# Patient Record
Sex: Female | Born: 1995 | Race: Black or African American | Hispanic: No | Marital: Single | Smoking: Former smoker
Health system: Southern US, Community
[De-identification: ages and names within clinical notes are randomized; demographics above are authoritative.]

## PROBLEM LIST (undated history)

## (undated) ENCOUNTER — Emergency Department (HOSPITAL_COMMUNITY): Admission: EM | Payer: BLUE CROSS/BLUE SHIELD | Source: Home / Self Care

## (undated) DIAGNOSIS — E282 Polycystic ovarian syndrome: Secondary | ICD-10-CM

## (undated) DIAGNOSIS — N809 Endometriosis, unspecified: Secondary | ICD-10-CM

## (undated) DIAGNOSIS — L0291 Cutaneous abscess, unspecified: Secondary | ICD-10-CM

## (undated) DIAGNOSIS — J45909 Unspecified asthma, uncomplicated: Secondary | ICD-10-CM

## (undated) HISTORY — PX: WISDOM TOOTH EXTRACTION: SHX21

## (undated) HISTORY — PX: LAPAROSCOPIC OVARIAN: SHX5906

---

## 2015-05-12 ENCOUNTER — Emergency Department (HOSPITAL_COMMUNITY)
Admission: EM | Admit: 2015-05-12 | Discharge: 2015-05-12 | Disposition: A | Discharge status: Home / Self Care | Payer: No Typology Code available for payment source | Attending: Emergency Medicine | Admitting: Emergency Medicine

## 2015-05-12 ENCOUNTER — Encounter (HOSPITAL_COMMUNITY): Payer: Self-pay | Admitting: Emergency Medicine

## 2015-05-12 DIAGNOSIS — Z720 Tobacco use: Secondary | ICD-10-CM | POA: Diagnosis not present

## 2015-05-12 DIAGNOSIS — J45909 Unspecified asthma, uncomplicated: Secondary | ICD-10-CM | POA: Diagnosis not present

## 2015-05-12 DIAGNOSIS — H109 Unspecified conjunctivitis: Secondary | ICD-10-CM | POA: Diagnosis not present

## 2015-05-12 DIAGNOSIS — H578 Other specified disorders of eye and adnexa: Secondary | ICD-10-CM | POA: Diagnosis present

## 2015-05-12 HISTORY — DX: Unspecified asthma, uncomplicated: J45.909

## 2015-05-12 MED ORDER — ERYTHROMYCIN 5 MG/GM OP OINT: TOPICAL_OINTMENT | OPHTHALMIC | Status: DC

## 2015-05-12 NOTE — Discharge Instructions (Signed)

## 2015-05-12 NOTE — ED Provider Notes (Signed)
CSN: [id number]     Arrival date & time 05/12/15  2016 History  This chart was scribed for Marlon Pel, PA-C, working with Marily Memos, MD by Elon Spanner, ED Scribe. This patient was seen in room TR06C/TR06C and the patient's care was started at 9:28 PM.   Chief Complaint  Patient presents with  . Conjunctivitis   The history is provided by the patient. No language interpreter was used.    HPI Comments: Amanda Shields is a 19 y.o. female who presents to the Emergency Department complaining of itching, non-painful left eye redness onset this morning upon awaking.  Associated symptoms include matting with goopy discharge, white/yellow after waking from a nap this afternoon.  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  She denies matting, drainage, discharge, vision changes, fever, neck pain, sore throat, ear pain.     Past Medical History  Diagnosis Date  . Asthma    Past Surgical History  Procedure Laterality Date  . Wisdom tooth extraction     No family history on file. Social History  Substance Use Topics  . Smoking status: Current Some Day Smoker  . Smokeless tobacco: None  . Alcohol Use: No   OB History    No data available     Review of Systems A complete 10 system review of systems was obtained and all systems are negative except as noted in the HPI and PMH.   Allergies  Review of patient's allergies indicates no known allergies.  Home Medications   Prior to Admission medications   Medication Sig Start Date End Date Taking? Authorizing Provider  erythromycin ophthalmic ointment Place a 1/2 inch ribbon of ointment BID into the lower eyelid for 7 days 05/12/15   Marlon Pel, PA-C   BP 142/69 mmHg  Pulse 100  Temp(Src) 98.5 F (36.9 C) (Oral)  Resp 20  SpO2 98%  LMP 05/05/2015 Physical Exam  Constitutional: She is oriented to person, place, and time. She  appears well-developed and well-nourished. No distress.  HENT:  Head: Normocephalic and atraumatic.  Eyes: EOM and lids are normal. Pupils are equal, round, and reactive to light. Left conjunctiva is injected. Left eye exhibits normal extraocular motion and no nystagmus.  Neck: Neck supple. No tracheal deviation present.  Cardiovascular: Normal rate.   Pulmonary/Chest: Effort normal. No respiratory distress.  Musculoskeletal: Normal range of motion.  Neurological: She is alert and oriented to person, place, and time.  Skin: Skin is warm and dry.  Psychiatric: She has a normal mood and affect. Her behavior is normal.  Nursing note and vitals reviewed.   ED Course  Procedures (including critical care time)  DIAGNOSTIC STUDIES: Oxygen Saturation is 98% on RA, normal by my interpretation.    COORDINATION OF CARE:  9:31 PM Will prescribe antibiotic.  Patient acknowledges and agrees with plan.    Labs Review Labs Reviewed - No data to display  Imaging Review No results found. I have personally reviewed and evaluated these images and lab results as part of my medical decision-making.   EKG Interpretation None      MDM   Final diagnoses:  Conjunctivitis of left eye    Erythromycin eye ointment,  symptoms consistent with conjunctivitis. Given patient education on how not to spread infection, change pillow case every morning. Advised to put medicine in right eye first, and then in left eye BID for 7  days.  Medications - No data to display  19 y.o.Amanda Shields's evaluation in the Emergency Department is complete. It has been determined that no acute conditions requiring further emergency intervention are present at this time. The patient/guardian have been advised of the diagnosis and plan. We have discussed signs and symptoms that warrant return to the ED, such as changes or worsening in symptoms.  Vital signs are stable at discharge. Filed Vitals:   05/12/15 2100  BP:  142/69  Pulse: 100  Temp: 98.5 F (36.9 C)  Resp: 20    Patient/guardian has voiced understanding and agreed to follow-up with the PCP or specialist.   I personally performed the services described in this documentation, which was scribed in my presence. The recorded information has been reviewed and is accurate.    Marlon Pel, PA-C 05/12/15 2145  Marily Memos, MD 05/13/15 [phone]

## 2015-05-12 NOTE — ED Notes (Signed)
Per patient, states she thinks she has pink eye in her left eye.

## 2015-05-12 NOTE — ED Notes (Signed)
Patient left at this time with all belongings. 

## 2015-06-16 ENCOUNTER — Encounter (HOSPITAL_COMMUNITY): Payer: Self-pay | Admitting: Emergency Medicine

## 2015-06-16 ENCOUNTER — Emergency Department (HOSPITAL_COMMUNITY)
Admission: EM | Admit: 2015-06-16 | Discharge: 2015-06-16 | Disposition: A | Discharge status: Home / Self Care | Payer: No Typology Code available for payment source | Attending: Physician Assistant | Admitting: Physician Assistant

## 2015-06-16 DIAGNOSIS — J069 Acute upper respiratory infection, unspecified: Secondary | ICD-10-CM | POA: Diagnosis not present

## 2015-06-16 DIAGNOSIS — H6501 Acute serous otitis media, right ear: Secondary | ICD-10-CM | POA: Insufficient documentation

## 2015-06-16 DIAGNOSIS — Z720 Tobacco use: Secondary | ICD-10-CM | POA: Diagnosis not present

## 2015-06-16 DIAGNOSIS — J45909 Unspecified asthma, uncomplicated: Secondary | ICD-10-CM | POA: Insufficient documentation

## 2015-06-16 DIAGNOSIS — Z79899 Other long term (current) drug therapy: Secondary | ICD-10-CM | POA: Insufficient documentation

## 2015-06-16 DIAGNOSIS — J029 Acute pharyngitis, unspecified: Secondary | ICD-10-CM | POA: Diagnosis present

## 2015-06-16 LAB — RAPID STREP SCREEN (MED CTR MEBANE ONLY): Streptococcus, Group A Screen (Direct): NEGATIVE

## 2015-06-16 MED ORDER — FLUCONAZOLE 200 MG PO TABS: 200.0000 mg | ORAL_TABLET | Freq: Every day | ORAL | Status: AC

## 2015-06-16 MED ORDER — BENZONATATE 100 MG PO CAPS: 100.0000 mg | ORAL_CAPSULE | Freq: Three times a day (TID) | ORAL | Status: DC

## 2015-06-16 MED ORDER — AMOXICILLIN 500 MG PO CAPS: 500.0000 mg | ORAL_CAPSULE | Freq: Three times a day (TID) | ORAL | Status: DC

## 2015-06-16 MED ORDER — FLUCONAZOLE 200 MG PO TABS: 200.0000 mg | ORAL_TABLET | Freq: Every day | ORAL | Status: DC

## 2015-06-16 NOTE — ED Provider Notes (Signed)
CSN: [id number]     Arrival date & time 06/16/15  1916 History  By signing my name below, I, Tanda Rockers, attest that this documentation has been prepared under the direction and in the presence of Kerrie Buffalo, NP. Electronically Signed: Tanda Rockers, ED Scribe. 06/16/2015. 8:06 PM.  Chief Complaint  Patient presents with  . Sore Throat  . Otalgia   Patient is a 19 y.o. female presenting with pharyngitis and ear pain. The history is provided by the patient. No language interpreter was used.  Sore Throat This is a new problem. The current episode started 2 days ago. The problem occurs rarely. The problem has not changed since onset.Pertinent negatives include no chest pain, no abdominal pain, no headaches and no shortness of breath. The symptoms are aggravated by swallowing and eating. She has tried nothing for the symptoms. The treatment provided no relief.  Otalgia Location:  Right Quality:  Aching Severity:  Mild Onset quality:  Gradual Duration:  1 day Timing:  Constant Progression:  Unchanged Chronicity:  New Relieved by:  None tried Worsened by:  Nothing tried Ineffective treatments:  None tried Associated symptoms: cough and sore throat   Associated symptoms: no abdominal pain, no fever, no headaches and no vomiting      HPI Comments: Starlina Lapre is a 19 y.o. female who presents to the Emergency Department complaining of gradual onset, constant, sore throat x 2 days. Pt states that she woke up with a dry, itching sensation in her throat. She notes that this morning she woke up with right ear pain as well. The ear pain is exacerbated with chewing and swallowing. She also complains of a mild dry cough. Denies fever, chills, nausea, vomiting, abdominal pain, or any other associated symptoms.    Past Medical History  Diagnosis Date  . Asthma    Past Surgical History  Procedure Laterality Date  . Wisdom tooth extraction     No family history on file. Social History   Substance Use Topics  . Smoking status: Current Some Day Smoker  . Smokeless tobacco: None  . Alcohol Use: No   OB History    No data available     Review of Systems  Constitutional: Negative for fever and chills.  HENT: Positive for ear pain (Right) and sore throat.   Respiratory: Positive for cough. Negative for shortness of breath.   Cardiovascular: Negative for chest pain.  Gastrointestinal: Negative for nausea, vomiting and abdominal pain.  Neurological: Negative for headaches.  All other systems reviewed and are negative.  Allergies  Review of patient's allergies indicates no known allergies.  Home Medications   Prior to Admission medications   Medication Sig Start Date End Date Taking? Authorizing Provider  amoxicillin (AMOXIL) 500 MG capsule Take 1 capsule (500 mg total) by mouth 3 (three) times daily. 06/16/15   Hope Orlene Och, NP  benzonatate (TESSALON) 100 MG capsule Take 1 capsule (100 mg total) by mouth every 8 (eight) hours. 06/16/15   Hope Orlene Och, NP  erythromycin ophthalmic ointment Place a 1/2 inch ribbon of ointment BID into the lower eyelid for 7 days 05/12/15   Marlon Pel, PA-C  fluconazole (DIFLUCAN) 200 MG tablet Take 1 tablet (200 mg total) by mouth daily. 06/16/15 06/23/15  Hope Orlene Och, NP   Triage Vitals: BP 146/74 mmHg  Pulse 96  Temp(Src) 99.5 F (37.5 C) (Oral)  Resp 18  Ht  (1.727 m)  Wt 237 lb (107.502 kg)  BMI 36.04 kg/m2  SpO2 99%   Physical Exam  Constitutional: She appears well-developed and well-nourished. No distress.  HENT:  Head: Normocephalic and atraumatic.  Right Ear: No mastoid tenderness. Tympanic membrane is erythematous and bulging.  Left Ear: Tympanic membrane normal.  Mouth/Throat: Uvula is midline. Posterior oropharyngeal erythema present. No posterior oropharyngeal edema.  Right TM is bulging  Eyes: Conjunctivae are normal. Pupils are equal, round, and reactive to light.  Sclera clear  Neck: Normal range of  motion.  Cardiovascular: Normal rate, regular rhythm, normal heart sounds and intact distal pulses.   Pulmonary/Chest: Effort normal and breath sounds normal. She has no wheezes.  Abdominal: Soft. Bowel sounds are normal. There is no tenderness. There is no CVA tenderness.  Musculoskeletal: Normal range of motion.  Lymphadenopathy:    She has no cervical adenopathy.  Neurological: She is alert.  Skin: Skin is warm and dry.  Psychiatric: She has a normal mood and affect.  Nursing note and vitals reviewed.   ED Course  Procedures (including critical care time)  DIAGNOSTIC STUDIES: Oxygen Saturation is 99% on RA, normal by my interpretation.    COORDINATION OF CARE: 8:04 PM-Discussed treatment plan which includes Rx antibiotics and tessalon pearls with pt at bedside and pt agreed to plan.   Labs Review  MDM  19 y.o. female with sore throat and right ear pain x 2 days. Stable for d/c without fever or mastoid tenderness and does not appear toxic.  Will treat for Otitis media and she will follow up with her PCP or return here as needed.   Final diagnoses:  Right acute serous otitis media, recurrence not specified  URI (upper respiratory infection)   I personally performed the services described in this documentation, which was scribed in my presence. The recorded information has been reviewed and is accurate.      [facility] Orlene OchM Neese, NP 06/18/15 0153  Courteney Randall AnLyn Mackuen, MD 06/19/15 [phone]

## 2015-06-16 NOTE — Discharge Instructions (Signed)
Cool Mist Vaporizers Vaporizers may help relieve the symptoms of a cough and cold. They add moisture to the air, which helps mucus to become thinner and less sticky. This makes it easier to breathe and cough up secretions. Cool mist vaporizers do not cause serious burns like hot mist vaporizers, which may also be called steamers or humidifiers. Vaporizers have not been proven to help with colds. You should not use a vaporizer if you are allergic to mold. HOME CARE INSTRUCTIONS  Follow the package instructions for the vaporizer.  Do not use anything other than distilled water in the vaporizer.  Do not run the vaporizer all of the time. This can cause mold or bacteria to grow in the vaporizer.  Clean the vaporizer after each time it is used.  Clean and dry the vaporizer well before storing it.  Stop using the vaporizer if worsening respiratory symptoms develop.   This information is not intended to replace advice given to you by your health care provider. Make sure you discuss any questions you have with your health care provider.   Document Released: 05/18/2004 Document Revised: 08/26/2013 Document Reviewed: 01/08/2013 Elsevier Interactive Patient Education 2016 [employer]  [employer] Mist Vaporizers Vaporizers may help relieve the symptoms of a cough and cold. They add moisture to the air, which helps mucus to become thinner and less sticky. This makes it easier to breathe and cough up secretions. Cool mist vaporizers do not cause serious burns like hot mist vaporizers, which may also be called steamers or humidifiers. Vaporizers have not been proven to help with colds. You should not use a vaporizer if you are allergic to mold. HOME CARE INSTRUCTIONS  Follow the package instructions for the vaporizer.  Do not use anything other than distilled water in the vaporizer.  Do not run the vaporizer all of the time. This can cause mold or bacteria to grow in the vaporizer.  Clean the vaporizer  after each time it is used.  Clean and dry the vaporizer well before storing it.  Stop using the vaporizer if worsening respiratory symptoms develop.   This information is not intended to replace advice given to you by your health care provider. Make sure you discuss any questions you have with your health care provider.   Document Released: 05/18/2004 Document Revised: 08/26/2013 Document Reviewed: 01/08/2013 Elsevier Interactive Patient Education [employer]

## 2015-06-16 NOTE — ED Notes (Signed)
Onset 2 days ago sore throat and right ear pain. Currently pain 2/10 sore.

## 2015-06-19 LAB — CULTURE, GROUP A STREP

## 2015-08-05 ENCOUNTER — Emergency Department (HOSPITAL_COMMUNITY)
Admission: EM | Admit: 2015-08-05 | Discharge: 2015-08-05 | Disposition: A | Discharge status: Home / Self Care | Payer: No Typology Code available for payment source | Attending: Emergency Medicine | Admitting: Emergency Medicine

## 2015-08-05 ENCOUNTER — Encounter (HOSPITAL_COMMUNITY): Payer: Self-pay | Admitting: Neurology

## 2015-08-05 DIAGNOSIS — Z791 Long term (current) use of non-steroidal anti-inflammatories (NSAID): Secondary | ICD-10-CM | POA: Insufficient documentation

## 2015-08-05 DIAGNOSIS — J069 Acute upper respiratory infection, unspecified: Secondary | ICD-10-CM | POA: Insufficient documentation

## 2015-08-05 DIAGNOSIS — H9201 Otalgia, right ear: Secondary | ICD-10-CM | POA: Insufficient documentation

## 2015-08-05 DIAGNOSIS — F17200 Nicotine dependence, unspecified, uncomplicated: Secondary | ICD-10-CM | POA: Insufficient documentation

## 2015-08-05 DIAGNOSIS — J45901 Unspecified asthma with (acute) exacerbation: Secondary | ICD-10-CM | POA: Insufficient documentation

## 2015-08-05 DIAGNOSIS — Z792 Long term (current) use of antibiotics: Secondary | ICD-10-CM | POA: Insufficient documentation

## 2015-08-05 LAB — RAPID STREP SCREEN (MED CTR MEBANE ONLY): Streptococcus, Group A Screen (Direct): NEGATIVE

## 2015-08-05 MED ORDER — IBUPROFEN 800 MG PO TABS: 800.0000 mg | ORAL_TABLET | Freq: Three times a day (TID) | ORAL | Status: DC

## 2015-08-05 MED ORDER — LIDOCAINE VISCOUS 2 % MT SOLN
15.0000 mL | Freq: Once | OROMUCOSAL | Status: AC
  Administered 2015-08-05: 15 mL via OROMUCOSAL
  Filled 2015-08-05: qty 15

## 2015-08-05 MED ORDER — IBUPROFEN 400 MG PO TABS
800.0000 mg | ORAL_TABLET | Freq: Once | ORAL | Status: AC
  Administered 2015-08-05: 800 mg via ORAL
  Filled 2015-08-05: qty 2

## 2015-08-05 MED ORDER — TRAMADOL HCL 50 MG PO TABS
50.0000 mg | ORAL_TABLET | Freq: Once | ORAL | Status: AC
  Administered 2015-08-05: 50 mg via ORAL
  Filled 2015-08-05: qty 1

## 2015-08-05 MED ORDER — LIDOCAINE VISCOUS 2 % MT SOLN: 20.0000 mL | OROMUCOSAL | Status: DC | PRN

## 2015-08-05 NOTE — Discharge Instructions (Signed)
Upper Respiratory Infection, Adult Use the resource guide to find a provider. Take ibuprofen for flulike symptoms and muscle aches. Use the viscous lidocaine to soothe your throat. Return for increased throat swelling or difficulty breathing or swallowing. Most upper respiratory infections (URIs) are a viral infection of the air passages leading to the lungs. A URI affects the nose, throat, and upper air passages. The most common type of URI is nasopharyngitis and is typically referred to as "the common cold." URIs run their course and usually go away on their own. Most of the time, a URI does not require medical attention, but sometimes a bacterial infection in the upper airways can follow a viral infection. This is called a secondary infection. Sinus and middle ear infections are common types of secondary upper respiratory infections. Bacterial pneumonia can also complicate a URI. A URI can worsen asthma and chronic obstructive pulmonary disease (COPD). Sometimes, these complications can require emergency medical care and may be life threatening.  CAUSES Almost all URIs are caused by viruses. A virus is a type of germ and can spread from one person to another.  RISKS FACTORS You may be at risk for a URI if:   You smoke.   You have chronic heart or lung disease.  You have a weakened defense (immune) system.   You are very young or very old.   You have nasal allergies or asthma.  You work in crowded or poorly ventilated areas.  You work in health care facilities or schools. SIGNS AND SYMPTOMS  Symptoms typically develop 2-3 days after you come in contact with a cold virus. Most viral URIs last 7-10 days. However, viral URIs from the influenza virus (flu virus) can last 14-18 days and are typically more severe. Symptoms may include:   Runny or stuffy (congested) nose.   Sneezing.   Cough.   Sore throat.   Headache.   Fatigue.   Fever.   Loss of appetite.   Pain in  your forehead, behind your eyes, and over your cheekbones (sinus pain).  Muscle aches.  DIAGNOSIS  Your health care provider may diagnose a URI by:  Physical exam.  Tests to check that your symptoms are not due to another condition such as:  Strep throat.  Sinusitis.  Pneumonia.  Asthma. TREATMENT  A URI goes away on its own with time. It cannot be cured with medicines, but medicines may be prescribed or recommended to relieve symptoms. Medicines may help:  Reduce your fever.  Reduce your cough.  Relieve nasal congestion. HOME CARE INSTRUCTIONS   Take medicines only as directed by your health care provider.   Gargle warm saltwater or take cough drops to comfort your throat as directed by your health care provider.  Use a warm mist humidifier or inhale steam from a shower to increase air moisture. This may make it easier to breathe.  Drink enough fluid to keep your urine clear or pale yellow.   Eat soups and other clear broths and maintain good nutrition.   Rest as needed.   Return to work when your temperature has returned to normal or as your health care provider advises. You may need to stay home longer to avoid infecting others. You can also use a face mask and careful hand washing to prevent spread of the virus.  Increase the usage of your inhaler if you have asthma.   Do not use any tobacco products, including cigarettes, chewing tobacco, or electronic cigarettes. If you need help  quitting, ask your health care provider. PREVENTION  The best way to protect yourself from getting a cold is to practice good hygiene.   Avoid oral or hand contact with people with cold symptoms.   Wash your hands often if contact occurs.  There is no clear evidence that vitamin C, vitamin E, echinacea, or exercise reduces the chance of developing a cold. However, it is always recommended to get plenty of rest, exercise, and practice good nutrition.  SEEK MEDICAL CARE IF:    You are getting worse rather than better.   Your symptoms are not controlled by medicine.   You have chills.  You have worsening shortness of breath.  You have brown or red mucus.  You have yellow or brown nasal discharge.  You have pain in your face, especially when you bend forward.  You have a fever.  You have swollen neck glands.  You have pain while swallowing.  You have white areas in the back of your throat. SEEK IMMEDIATE MEDICAL CARE IF:   You have severe or persistent:  Headache.  Ear pain.  Sinus pain.  Chest pain.  You have chronic lung disease and any of the following:  Wheezing.  Prolonged cough.  Coughing up blood.  A change in your usual mucus.  You have a stiff neck.  You have changes in your:  Vision.  Hearing.  Thinking.  Mood. MAKE SURE YOU:   Understand these instructions.  Will watch your condition.  Will get help right away if you are not doing well or get worse.   This information is not intended to replace advice given to you by your health care provider. Make sure you discuss any questions you have with your health care provider.   Document Released: 02/14/2001 Document Revised: 01/05/2015 Document Reviewed: 11/26/2013 Elsevier Interactive Patient Education 2016 [employer]  Emergency Department Resource Guide 1) Find a Doctor and Pay Out of Pocket Although you won't have to find out who is covered by your insurance plan, it is a good idea to ask around and get recommendations. You will then need to call the office and see if the doctor you have chosen will accept you as a new patient and what types of options they offer for patients who are self-pay. Some doctors offer discounts or will set up payment plans for their patients who do not have insurance, but you will need to ask so you aren't surprised when you get to your appointment.  2) Contact Your Local Health Department Not all health departments have  doctors that can see patients for sick visits, but many do, so it is worth a call to see if yours does. If you don't know where your local health department is, you can check in your phone book. The CDC also has a tool to help you locate your state's health department, and many state websites also have listings of all of their local health departments.  3) Find a Walk-in Clinic If your illness is not likely to be very severe or complicated, you may want to try a walk in clinic. These are popping up all over the country in pharmacies, drugstores, and shopping centers. They're usually staffed by nurse practitioners or physician assistants that have been trained to treat common illnesses and complaints. They're usually fairly quick and inexpensive. However, if you have serious medical issues or chronic medical problems, these are probably not your best option.  No Primary Care Doctor: - Call Health Connect at  [phone] - they can help you locate a primary care doctor that  accepts your insurance, provides certain services, etc. - Physician Referral Service- [phone]  Chronic Pain Problems: Organization         Address  Phone   Notes  [facility]  [phone] Patients need to be referred by their primary care doctor.   Medication Assistance: Organization         Address  Phone   Notes  [facility] Medication [facility] [address] [place], [place] [address] [phone] --Must be a resident of [facility] -- Must have NO insurance coverage whatsoever (no Medicaid/ Medicare, etc.) -- The pt. MUST have a primary care doctor that directs their care regularly and follows them in the community   Med[facility]ist  [phone]   [employer]  [phone]    Agencies that provide inexpensive medical care: Organization         Address  Phone   Notes  [facility]  [phone]   [facility]    [phone]    [facility] [address] [place], [place] [address] [phone]   [facility] [address] [place], [place] [phone]   [facility]anned Parenthood    [phone]   [facility]    [phone]   [facility] and [facility]  [address], [place] Phone:  [phone], Fax:  [phone] Hours of Operation:  9 am - 6 pm, M-F.  Also accepts Medicaid/Medicare and self-pay.  [facility]  [address], [place] Phone: [phone], Fax: [phone]. Hours of Operation:  8:30 am - 5:30 pm, M-F.  Also accepts Medicaid and self-pay.  [facility] [place] [address], [place] Phone: [phone]   [facility] [address] Natasha Bence [place], [place] [phone], Ext. [phone] Mondays & Thursdays: 7-9 AM.  First 15 patients are seen on a first come, first serve basis.    Medicaid-accepting [facility] Providers:  Organization         Address  Phone   Notes  [facility] [address], [place] [phone] Also accepts self-pay patients.  [facility] [address] [place]ure[place], [place]  [phone]   [facility] [address], [place] [phone]   [facility] [address], [place] [phone]   Renaye Rakers [address], [place]   [phone] Only accepts [place] Access [place] patients after they have their name applied to their card.   Self-Pay (no insurance) in [facility]:  Organization         Address  Phone   Notes  Sickle Cell Patients, [facility] [address] [place], [place] [phone]   [facility] [address] [place], [place] [phone]   [facility] [place]  [address], [place] [phone]   [facility]/Dr. Osei-Bonsu  [address], [place] or [address], [place] [phone] Phone number for both [place] and [place] locations is the same.  [facility] [address], [place] [phone]   [facility] [address], [place]  or [address] [place] [address] [phone]   [facility] [address], [place] [phone], phone; [phone], fax Sees patients 1st and 3rd Saturday of every month.  Must not qualify for public or private insurance (i.e. Medicaid, Medicare, [place] Health Choice, Veterans' Benefits)  Household income should be no more than 200% of the poverty level The clinic cannot treat you if you are pregnant or think you are pregnant  Sexually transmitted diseases are not treated at the clinic.    Dental Care: Organization         Address  Phone  Notes  [facility] Department of [facility] [address] [place], [place] [phone] Accepts children up to age 37 who are enrolled in [place] or [place] Health Choice; pregnant women with a Medicaid card; and children who have applied for Medicaid or [place] Health Choice, but were declined, whose parents can pay a reduced fee at time of service.  [facility]  [address], [place] [phone] Accepts children up to age 24 who are enrolled in [place] or [place] Health Choice; pregnant women with a Medicaid card; and children who have applied for Medicaid or [place] Health Choice, but were declined, whose parents can pay a reduced fee at time of service.  [facility]  [address] [place], [place] [phone] Patients are seen by appointment only. Walk-ins are not accepted. [facility] will see patients 60 years of age and older. Monday - Tuesday (8am-5pm) Most Wednesdays (8:30-5pm) $30 per visit, cash only  [facility]  Adult Dental Access PROGRAM  [address], [facility] [phone] Patients are seen by appointment only. Walk-ins are not accepted. [facility] will see patients 50 years of age and older. One Wednesday Evening (Monthly: Volunteer Based).  $30 per visit, cash only  [employer]  [phone] for adults; Children under age 11, call Graduate Pediatric Dentistry at [phone]. Children aged 45-14, please call [phone] to request a pediatric application.  Dental services are provided in all areas of dental care including fillings, crowns and bridges, complete and partial dentures, implants, gum treatment, root canals, and extractions. Preventive care is also provided. Treatment is provided to both adults and children. Patients are selected via a lottery and there is often a waiting list.   [facility] [address] [place]  [phone] [email or web address]   [facility] [address] [place], [place] [phone], Ext. [phone] Second and Fourth Thursday of each month, opens at 6:30 AM; Clinic ends at 9 AM.  Patients are seen on a first-come first-served basis, and a limited number are seen during each clinic.   [facility]  [address] [place], [place] [phone]   Eligibility Requirements You must have lived in [place], [place], or [place] counties for at least the last three months.   You cannot be eligible for state or federal sponsored [place], including [employer], [place], or [employer].   You generally cannot be eligible for healthcare insurance through your employer.    How to apply: Eligibility screenings are held every Tuesday and Wednesday afternoon from 1:00 pm until 4:00 pm. You do not need an appointment for the interview!  [facility] [address], [place], [place] [phone]   [facility] Department  [phone]   [email or web address]  [employer] Health Department  [phone]   [facility] Health Department  [phone]    Behavioral Health Resources in the Community: Intensive Outpatient Programs Organization         Address  Phone  Notes  [facility] [address], [place], [place] [phone]   [facility] Outpatient [address] [place], [place] [phone]   [facility] [address], [place], [place]  [phone]   [facility] [address]  [place], [place] [phone] or [phone]   Substance Abuse Resources Organization         Address  Phone  Notes  [facility] Drug [facility]  [phone]   [facility]  [phone]   [facility] [place]  [phone]   Floydene Flock  [phone]   Residential & Outpatient Substance Abuse Program  [phone]   Psychological Services Organization         Address  Phone  Notes  [facility]  [phone]   [facility] Services  [phone]   [facility] [address] [place] [phone] or [phone]    Mobile Crisis Teams Organization         Address  Phone  Notes  [facility], Mobile Crisis Care Unit  [phone]   [facility]  [address] [place], [place] [phone]   Doristine Locks [address] [place] [phone]    Self-Help/Support Groups Organization         Address  Phone             Notes  [facility] [place] - variety of support groups  [phone]- [phone] Call for more information  Narcotics Anonymous (NA), [facility] [address], [employer] [place]  2 meetings at this location   Statistician         Address  Phone  Notes  [facility] [address],    [place]  [phone]   [facility]  [address], [place] [id number], [place], [place] [phone]   [facility] Treatment  Facility [address] [place], [place] [phone] Admissions: 8am-3pm M-F  [facility] [address]    [place], [place] [phone]   [facility] [address] [place], [place], [place] [phone]   [facility] [address],  [place], [place] [phone]   [facility] - Intensive Outpatient [address]ure[address], [place], [place] [phone]   [facility] ([facility] [address]  [place], [place] [phone] or [phone]   [facility] ([facility]) [address] [place], [place] [phone] Accepts Medicaid  [employer] [facility] [address],  [place] [phone] Substance Abuse/Addiction Treatment   [facility] Organization         Address  Phone  Notes  [facility]  [phone]   Angie Fava, PhD [address] Ervin Knack [place], [place]   [phone] or [phone]   [facility] Behavioral   [address] [place], [place] [phone]   [facility] [address] [place], [place] [phone] Insurance/Medicaid/sponsorship through [employer] and Families [address]  [place], [place] [phone] [place], [place] [phone]    Dr. Adele Schilder  [phone]   [facility] Dept. 1) [address] [place] 2) [place] 3)  [address], [place] [phone]  [phone]   [place] [phone] or [phone] (After Hours)

## 2015-08-05 NOTE — ED Notes (Signed)
Pt reports right ear pain, sneezing, sore throat for several days.

## 2015-08-05 NOTE — ED Provider Notes (Signed)
CSN: [id number]     Arrival date & time 08/05/15  1424 History  By signing my name below, I, Ronney LionSuzanne Le, attest that this documentation has been prepared under the direction and in the presence of [employer] StoresHanna Patel-Mills, PA-C. Electronically Signed: Ronney LionSuzanne Le, ED Scribe. 08/05/2015. 4:38 PM.    Chief Complaint  Patient presents with  . Otalgia  . Sore Throat   The history is provided by the patient. No language interpreter was used.    HPI Comments: Amanda Shields is a 19 y.o. female with a history of asthma and bronchitis, who presents to the Emergency Department complaining of a gradual-onset, constant, 5/10 sore throat that began several days ago. She then subsequently developed left otalgia, diffuse headache, a cough that is intermittently productive with clear sputum, sneezing, and SOB. Patient notes a history of asthma but states she has not needed to use her inhaler for "a long time." She states she has not taken any medications for her symptoms.   Past Medical History  Diagnosis Date  . Asthma    Past Surgical History  Procedure Laterality Date  . Wisdom tooth extraction     No family history on file. Social History  Substance Use Topics  . Smoking status: Current Some Day Smoker  . Smokeless tobacco: None  . Alcohol Use: No   OB History    No data available     Review of Systems  HENT: Positive for ear pain, sneezing and sore throat.   Respiratory: Positive for shortness of breath.   Neurological: Positive for headaches.   Allergies  Review of patient's allergies indicates no known allergies.  Home Medications   Prior to Admission medications   Medication Sig Start Date End Date Taking? Authorizing Provider  amoxicillin (AMOXIL) 500 MG capsule Take 1 capsule (500 mg total) by mouth 3 (three) times daily. 06/16/15   Hope Orlene OchM Neese, NP  benzonatate (TESSALON) 100 MG capsule Take 1 capsule (100 mg total) by mouth every 8 (eight) hours. 06/16/15   Hope Orlene OchM Neese, NP   erythromycin ophthalmic ointment Place a 1/2 inch ribbon of ointment BID into the lower eyelid for 7 days 05/12/15   Marlon Peliffany Greene, PA-C  ibuprofen (ADVIL,MOTRIN) 800 MG tablet Take 1 tablet (800 mg total) by mouth 3 (three) times daily. 08/05/15   Trayveon Beckford Patel-Mills, PA-C  lidocaine (XYLOCAINE) 2 % solution Use as directed 20 mLs in the mouth or throat as needed for mouth pain. 08/05/15   Manu Rubey Patel-Mills, PA-C   BP 127/80 mmHg  Pulse 87  Temp(Src) 98 F (36.7 C) (Oral)  Resp 16  SpO2 100%  LMP 07/08/2015 Physical Exam  Constitutional: She is oriented to person, place, and time. She appears well-developed and well-nourished. No distress.  HENT:  Head: Normocephalic and atraumatic.  Mouth/Throat: Oropharyngeal exudate and posterior oropharyngeal edema present.  Bilateral tonsillar edema and exudates. No kissing tonsils. No drooling or trismus. No respiratory distress. Uvula is midline without swelling. No hot potato voice. Bilateral TMs and ear canals are without erythema.  Eyes: Conjunctivae and EOM are normal.  Neck: Neck supple. No tracheal deviation present.  No anterior cervical lymphadenopathy.  Cardiovascular: Normal rate.   Pulmonary/Chest: Effort normal and breath sounds normal. No respiratory distress. She has no decreased breath sounds. She has no wheezes. She has no rhonchi. She has no rales.  Lungs are clear to auscultation bilaterally.   Musculoskeletal: Normal range of motion.  Lymphadenopathy:    She has no cervical adenopathy.  Neurological:  She is alert and oriented to person, place, and time.  Skin: Skin is warm and dry.  Psychiatric: She has a normal mood and affect. Her behavior is normal.  Nursing note and vitals reviewed.   ED Course  Procedures (including critical care time)  DIAGNOSTIC STUDIES: Oxygen Saturation is 99% on RA, normal by my interpretation.    COORDINATION OF CARE: 3:23 PM - Pt made aware of negative strep screen. Discussed treatment plan  with pt at bedside which includes symptomatic treatment. Discussed treatment plan with pt which includes ibuprofen and viscous lidocaine solution. Pt refused Decadron injection, stating, "I'll just stick it out." Pt verbalized understanding and agreed to plan.   Labs Review Labs Reviewed  RAPID STREP SCREEN (NOT AT [facility])  CULTURE, GROUP A STREP   MDM   Final diagnoses:  Viral upper respiratory illness  Rapid strep screen negative. Patients symptoms are consistent with URI, likely viral etiology. Discussed that antibiotics are not indicated for viral infections. Pt will be discharged with symptomatic treatment, including ibuprofen and Rx viscous lidocaine solution. Pt refused Decadron injection. Pt verbalizes understanding and is agreeable with plan. Pt is hemodynamically stable & in NAD prior to dc.  I personally performed the services described in this documentation, which was scribed in my presence. The recorded information has been reviewed and is accurate.    Catha Gosselin, PA-C 08/05/15 1638  Lyndal Pulley, MD 08/06/15 [phone]

## 2015-08-05 NOTE — ED Notes (Signed)
Pt able to ambulate independently 

## 2015-08-07 LAB — CULTURE, GROUP A STREP

## 2015-10-28 ENCOUNTER — Emergency Department (HOSPITAL_COMMUNITY)
Admission: EM | Admit: 2015-10-28 | Discharge: 2015-10-28 | Disposition: A | Discharge status: Home / Self Care | Payer: No Typology Code available for payment source | Attending: Emergency Medicine | Admitting: Emergency Medicine

## 2015-10-28 ENCOUNTER — Encounter (HOSPITAL_COMMUNITY): Payer: Self-pay | Admitting: Emergency Medicine

## 2015-10-28 DIAGNOSIS — S39012A Strain of muscle, fascia and tendon of lower back, initial encounter: Secondary | ICD-10-CM | POA: Insufficient documentation

## 2015-10-28 DIAGNOSIS — Y9289 Other specified places as the place of occurrence of the external cause: Secondary | ICD-10-CM | POA: Insufficient documentation

## 2015-10-28 DIAGNOSIS — M545 Low back pain, unspecified: Secondary | ICD-10-CM

## 2015-10-28 DIAGNOSIS — X501XXA Overexertion from prolonged static or awkward postures, initial encounter: Secondary | ICD-10-CM | POA: Insufficient documentation

## 2015-10-28 DIAGNOSIS — Y990 Civilian activity done for income or pay: Secondary | ICD-10-CM | POA: Insufficient documentation

## 2015-10-28 DIAGNOSIS — Y9389 Activity, other specified: Secondary | ICD-10-CM | POA: Insufficient documentation

## 2015-10-28 DIAGNOSIS — Z79899 Other long term (current) drug therapy: Secondary | ICD-10-CM | POA: Insufficient documentation

## 2015-10-28 DIAGNOSIS — J45909 Unspecified asthma, uncomplicated: Secondary | ICD-10-CM | POA: Insufficient documentation

## 2015-10-28 DIAGNOSIS — F17200 Nicotine dependence, unspecified, uncomplicated: Secondary | ICD-10-CM | POA: Insufficient documentation

## 2015-10-28 DIAGNOSIS — Z792 Long term (current) use of antibiotics: Secondary | ICD-10-CM | POA: Insufficient documentation

## 2015-10-28 MED ORDER — IBUPROFEN 800 MG PO TABS: 800.0000 mg | ORAL_TABLET | Freq: Three times a day (TID) | ORAL | Status: DC

## 2015-10-28 NOTE — ED Notes (Signed)
Patient able to ambulate independently  

## 2015-10-28 NOTE — Discharge Instructions (Signed)
1. Medications: ibuprofen, usual home medications 2. Treatment: rest, drink plenty of fluids, gentle stretching as discussed, alternate ice and heat 3. Follow Up: Please followup with your primary doctor in 3 days for discussion of your diagnoses and further evaluation after today's visit; if you do not have a primary care doctor use the resource guide provided to find one;  Return to the ER for worsening back pain, difficulty walking, loss of bowel or bladder control or other concerning symptoms     Back Exercises The following exercises strengthen the muscles that help to support the back. They also help to keep the lower back flexible. Doing these exercises can help to prevent back pain or lessen existing pain. If you have back pain or discomfort, try doing these exercises 2-3 times each day or as told by your health care provider. When the pain goes away, do them once each day, but increase the number of times that you repeat the steps for each exercise (do more repetitions). If you do not have back pain or discomfort, do these exercises once each day or as told by your health care provider. EXERCISES Single Knee to Chest Repeat these steps 3-5 times for each leg: 1. Lie on your back on a firm bed or the floor with your legs extended. 2. Bring one knee to your chest. Your other leg should stay extended and in contact with the floor. 3. Hold your knee in place by grabbing your knee or thigh. 4. Pull on your knee until you feel a gentle stretch in your lower back. 5. Hold the stretch for 10-30 seconds. 6. Slowly release and straighten your leg. Pelvic Tilt Repeat these steps 5-10 times: 1. Lie on your back on a firm bed or the floor with your legs extended. 2. Bend your knees so they are pointing toward the ceiling and your feet are flat on the floor. 3. Tighten your lower abdominal muscles to press your lower back against the floor. This motion will tilt your pelvis so your tailbone points  up toward the ceiling instead of pointing to your feet or the floor. 4. With gentle tension and even breathing, hold this position for 5-10 seconds. Cat-Cow Repeat these steps until your lower back becomes more flexible: 1. Get into a hands-and-knees position on a firm surface. Keep your hands under your shoulders, and keep your knees under your hips. You may place padding under your knees for comfort. 2. Let your head hang down, and point your tailbone toward the floor so your lower back becomes rounded like the back of a cat. 3. Hold this position for 5 seconds. 4. Slowly lift your head and point your tailbone up toward the ceiling so your back forms a sagging arch like the back of a cow. 5. Hold this position for 5 seconds. Press-Ups Repeat these steps 5-10 times: 1. Lie on your abdomen (face-down) on the floor. 2. Place your palms near your head, about shoulder-width apart. 3. While you keep your back as relaxed as possible and keep your hips on the floor, slowly straighten your arms to raise the top half of your body and lift your shoulders. Do not use your back muscles to raise your upper torso. You may adjust the placement of your hands to make yourself more comfortable. 4. Hold this position for 5 seconds while you keep your back relaxed. 5. Slowly return to lying flat on the floor. Bridges Repeat these steps 10 times: 1. Lie on your back  on a firm surface. 2. Bend your knees so they are pointing toward the ceiling and your feet are flat on the floor. 3. Tighten your buttocks muscles and lift your buttocks off of the floor until your waist is at almost the same height as your knees. You should feel the muscles working in your buttocks and the back of your thighs. If you do not feel these muscles, slide your feet 1-2 inches farther away from your buttocks. 4. Hold this position for 3-5 seconds. 5. Slowly lower your hips to the starting position, and allow your buttocks muscles to relax  completely. If this exercise is too easy, try doing it with your arms crossed over your chest. Abdominal Crunches Repeat these steps 5-10 times: 1. Lie on your back on a firm bed or the floor with your legs extended. 2. Bend your knees so they are pointing toward the ceiling and your feet are flat on the floor. 3. Cross your arms over your chest. 4. Tip your chin slightly toward your chest without bending your neck. 5. Tighten your abdominal muscles and slowly raise your trunk (torso) high enough to lift your shoulder blades a tiny bit off of the floor. Avoid raising your torso higher than that, because it can put too much stress on your low back and it does not help to strengthen your abdominal muscles. 6. Slowly return to your starting position. Back Lifts Repeat these steps 5-10 times: 1. Lie on your abdomen (face-down) with your arms at your sides, and rest your forehead on the floor. 2. Tighten the muscles in your legs and your buttocks. 3. Slowly lift your chest off of the floor while you keep your hips pressed to the floor. Keep the back of your head in line with the curve in your back. Your eyes should be looking at the floor. 4. Hold this position for 3-5 seconds. 5. Slowly return to your starting position. SEEK MEDICAL CARE IF:  Your back pain or discomfort gets much worse when you do an exercise.  Your back pain or discomfort does not lessen within 2 hours after you exercise. If you have any of these problems, stop doing these exercises right away. Do not do them again unless your health care provider says that you can. SEEK IMMEDIATE MEDICAL CARE IF:  You develop sudden, severe back pain. If this happens, stop doing the exercises right away. Do not do them again unless your health care provider says that you can.   This information is not intended to replace advice given to you by your health care provider. Make sure you discuss any questions you have with your health care  provider.   Document Released: 09/28/2004 Document Revised: 05/12/2015 Document Reviewed: 10/15/2014 Elsevier Interactive Patient Education 2016 [employer].    Emergency Department Resource Guide 1) Find a Doctor and Pay Out of Pocket Although you won't have to find out who is covered by your insurance plan, it is a good idea to ask around and get recommendations. You will then need to call the office and see if the doctor you have chosen will accept you as a new patient and what types of options they offer for patients who are self-pay. Some doctors offer discounts or will set up payment plans for their patients who do not have insurance, but you will need to ask so you aren't surprised when you get to your appointment.  2) Contact Your Local Health Department Not all health departments have doctors  that can see patients for sick visits, but many do, so it is worth a call to see if yours does. If you don't know where your local health department is, you can check in your phone book. The CDC also has a tool to help you locate your state's health department, and many state websites also have listings of all of their local health departments.  3) Find a [facility] If your illness is not likely to be very severe or complicated, you may want to try a walk in clinic. These are popping up all over the country in pharmacies, drugstores, and shopping centers. They're usually staffed by nurse practitioners or physician assistants that have been trained to treat common illnesses and complaints. They're usually fairly quick and inexpensive. However, if you have serious medical issues or chronic medical problems, these are probably not your best option.  No Primary Care Doctor: - Call Health Connect at  [phone] - they can help you locate a primary care doctor that  accepts your insurance, provides certain services, etc. - Physician Referral Service- [phone]  Chronic Pain  Problems: Organization         Address  Phone   Notes  [facility]  [phone] Patients need to be referred by their primary care doctor.   Medication Assistance: Organization         Address  Phone   Notes  [facility] Medication [facility] [address] [place], [place] [address] [phone] --Must be a resident of [facility] -- Must have NO insurance coverage whatsoever (no Medicaid/ Medicare, etc.) -- The pt. MUST have a primary care doctor that directs their care regularly and follows them in the community   [facility]  [phone]   [employer]  [phone]    Agencies that provide inexpensive medical care: Organization         Address  Phone   Notes  [place]  [phone]   [facility]    [phone]   [facility] [place], [place] [address] [phone]   [place] [address], [place] [phone]   [facility]anned Parenthood    [phone]   [facility]    [phone]   [place] and [address], [place] Phone:  [phone], Fax:  [phone] Hours of Operation:  9 am - 6 pm, M-F.  Also accepts Medicaid/Medicare and self-pay.  [facility] for [address], [address], [place] Phone: [phone], Fax: [phone]. Hours of Operation:  8:30 am - 5:30 pm, M-F.  Also accepts Medicaid and self-pay.  [facility] [place] [address] [place] Phone: [phone]   [place], [place], [place] [phone], Ext. [phone] Mondays & Thursdays: 7-9 AM.  First 15 patients are seen on a first come, first serve basis.    [place] Providers:  Organization         Address  Phone   Notes  [facility] [address], [place] [phone] Also  accepts self-pay patients.  [place], [place]  [phone]   [place], [address], [place] [phone]   [facility] [address] [place] [address], [place] [phone]) [phone]   °Veita Bland [address], [place]  ° ([phone] Only accepts [place] Access Medicaid patients after they have their name applied to their card.  ° °Self-Pay (no insurance) in [place]: ° °Organization         Address  Phone   Notes  °Sickle Cell Patients, [facility] [address], [place] ([phone]   °[facility] [address], [place] ([phone]) [phone]   °[facility] ° [address], [place] ([phone]) [phone]   °[facility]/Dr. Osei-Bonsu ° [address] [place] [address], [place] or [address], [place] ([phone]) [phone] Phone number for both [place] and [place] locations is the same.  °[facility] [address], [place] ([phone]) [phone]   °[facility] [address] [place] [address], [place] or [address] ([phone]) [phone] °(336) [phone]   °[facility] [address], [place] (336) [phone], phone; ([phone], fax Sees patients 1st and 3rd Saturday of every month.  Must not qualify for public or private insurance (i.e. Medicaid, Medicare, [place] Health Choice, Veterans' Benefits) • Household income should be no more than 200% of the poverty level •The clinic cannot treat you if you are pregnant or think you are pregnant • Sexually transmitted diseases are not treated at the clinic.  ° ° °Dental Care: °Organization         Address  Phone  Notes  °[facility] [address], [place] ([phone]) [phone] Accepts children up to age 21 who are enrolled in Medicaid or [place] Health Choice; pregnant  women with a Medicaid card; and children who have applied for Medicaid or [place] Health Choice, but were declined, whose parents can pay a reduced fee at time of service.  °[facility] [place]  [address], [place] ([phone]) [phone] Accepts children up to age 21 who are enrolled in Medicaid or [place] Health Choice; pregnant women with a Medicaid card; and children who have applied for Medicaid or  Health Choice, but were declined, whose parents can pay a reduced fee at time of service.  °[facility] ° [address], [place] ([phone]) [phone] Patients are seen by appointment only. Walk-ins are not accepted. [facility] will see patients 18 years of age and older. °Monday - Tuesday (8am-5pm) °Most Wednesdays (8:30-5pm) °$30 per visit, cash only  °[facility] ° [address], [place] ([phone]) [phone] Patients are seen by appointment only. Walk-ins are not accepted. [facility] will see patients 18 years of age and older. °One Wednesday Evening (Monthly: Volunteer Based).  $30 per visit, cash only  °[facility]  ([phone] for adults; Children under age 4, call Graduate Pediatric Dentistry at [phone]. Children aged 4-14, please call [phone] to request a pediatric application. ° Dental services are provided in all areas of dental care including fillings, crowns and bridges, complete and partial dentures, implants, gum treatment, root canals, and extractions. Preventive care is also provided. Treatment is provided to both adults and children. °Patients are selected via a lottery and there is often a waiting list. °  °[facility] [address], °[place] ° ([phone]) [phone] [email or web address] °  °[facility] [address], [place], [place] (336)[phone], Ext. [phone] Second and Fourth Thursday of each month, opens at 6:30 AM; Clinic ends at 9 AM.  Patients are  seen on a first-come first-served basis, and a limited number are seen during each clinic.  ° °[facility] ° [address], [place], [place] ([phone]) [phone]   Eligibility Requirements °You must have lived in [place], [place], or [place] counties for at least the last three months. °  You cannot be eligible for state or federal sponsored healthcare insurance, including Veterans Administration, Medicaid, or Medicare. °  You generally cannot be eligible for healthcare insurance through your employer.  °  How to apply: °Eligibility screenings are held every Tuesday and Wednesday afternoon from 1:00 pm until 4:00 pm. You do not need an appointment for the interview!  °[facility] [address], [place], [place] [phone]   °[facility] [facility]  336-342-8273   °[facility]  336-703-3100   °[facility]  336-570-6415   ° °Behavioral Health Resources in the Community: °Intensive Outpatient Programs °Organization         Address  Phone  Notes  °[facility] [address], [place], [place] 336-878-6098   °[facility] [address], [place], [place] [phone]   °[facility] [address], [place], [place] ° 336-882-2125   °[facility] [address],  °[place], [place] 1-800-853-5163 or 336-641-4981   °Substance Abuse Resources °Organization         Address  Phone  Notes  °[facility]  336-882-2125   °[facility]  336-784-9470   °[facility]  336-285-9073   °[facility]  336-845-3988   °Residential & Outpatient Substance Abuse Program  1-800-659-3381   °Psychological Services °Organization         Address  Phone  Notes  °[facility]  336- 832-9600   °[facility]  336- 378-7881   °[facility] [address], [place] 1-800-853-5163 or 336-641-4981   ° °Mobile Crisis  Teams °Organization         Address  Phone  Notes  °[facility], Mobile Crisis Care Unit  1-877-626-1772   °Assertive °[facility] ° [address] [place], [place] 336-834-9664   °Sharon DeEsch [address] °[place] 336-554-5454   ° °Self-Help/Support Groups °Organization         Address  Phone             Notes  °[facility] [place] - variety of support groups  336- 373-1402 Call for more information  °Narcotics Anonymous (NA), [facility] [address], °[place]  2 meetings at this location  ° °Residential Treatment Programs °Organization         Address  Phone  Notes  °[facility] [address],    °[place]  1-866-801-8205   °[facility] ° [address], [place], [place] 704-293-8524   °[facility] [address], [place] 336-845-3988 Admissions: 8am-3pm M-F  °[facility] [address]    °[place], [place] 336-841-1104   °[facility] [address], [place], [place] 336-379-7146   °[facility] [address]  °[place],  336-285-9073   °[facility] - Intensive Outpatient [address], [place],  336-852-3033   °  336-852-3033   °[facility] [address]  °[place], [place] 1-877-615-2722 or 336-784-9470   °[facility] ([facility]) [address] [place], [place] 336-227-7417 Accepts Medicaid  °[facility] [address]  °[place] 1-800-659-3381 Substance Abuse/Addiction Treatment  ° °[place] Behavioral Health Resources °Organization         Address  Phone  Notes  °[facility]  (888) 581-9988   °Julie Brannon, PhD [address] [place], [place]   (336) 349-5553 or (336) 951-0000   °[facility]   [address] °[place], [place] (336) 349-4454   °[facility] [address], [place], [place] (336) 342-8316  Insurance/Medicaid/sponsorship through [facility]  °[facility] [address]                                    [place], [place] (336) 342-8316 Therapy/tele-psych/case  °[facility] [address]  ° [place], [place] (336) 349-2233    °Dr. Arfeen  (336) 349-4544   °[facility] [place]  [employer] [place] [facility] 1) [address], [place] °2) [address], [place] °3)  [address], [place] (336) 349-3220 °(336) 342-7768 ° °(336) 342-8140   °[place] Child Abuse Hotline (336) 342-1394 or (336) 342-3537 (After Hours)    ° ° ° °

## 2015-10-28 NOTE — ED Notes (Signed)
Patient here with complaint of lower back pain. States onset 3 weeks ago. Was seen, treated, and released. Presents tonight complaining of the same. States she has followed the original instructions without improvement.

## 2015-10-28 NOTE — ED Provider Notes (Signed)
CSN: [id number]     Arrival date & time 10/28/15  1936 History   First MD Initiated Contact with Patient 10/28/15 2021     Chief Complaint  Patient presents with  . Back Pain     (Consider location/radiation/quality/duration/timing/severity/associated sxs/prior Treatment) The history is provided by the patient and medical records. No language interpreter was used.     Amanda Shields is a 20 y.o. female  with a hx of asthma presents to the Emergency Department complaining of gradual, persistent, low back pain approx 6 weeks ago.  Pt reports the pain began after she lifted a heavy box at work.  She saw a physician who recommended using a heating pad. She reports she has been doing this with moderate improvement however in the last few days he has not helped. She has taken no medications for the pain. No exercises have been tried. Patient reports that bending worsens her pain and heat makes it better but she is able to walk without difficulty. She denies IV drug use, anticoagulants, history of cancer.  , Chills, nausea, vomiting, abdominal pain, weakness, numbness, loss of bowel or bladder control.    Past Medical History  Diagnosis Date  . Asthma    Past Surgical History  Procedure Laterality Date  . Wisdom tooth extraction     No family history on file. Social History  Substance Use Topics  . Smoking status: Current Some Day Smoker  . Smokeless tobacco: None  . Alcohol Use: No   OB History    No data available     Review of Systems  Constitutional: Negative for fever and fatigue.  Respiratory: Negative for chest tightness and shortness of breath.   Cardiovascular: Negative for chest pain.  Gastrointestinal: Negative for nausea, vomiting, abdominal pain and diarrhea.  Genitourinary: Negative for dysuria, urgency, frequency and hematuria.  Musculoskeletal: Positive for back pain. Negative for joint swelling, gait problem, neck pain and neck stiffness.  Skin: Negative for rash.   Neurological: Negative for weakness, light-headedness, numbness and headaches.  All other systems reviewed and are negative.     Allergies  Review of patient's allergies indicates no known allergies.  Home Medications   Prior to Admission medications   Medication Sig Start Date End Date Taking? Authorizing Provider  amoxicillin (AMOXIL) 500 MG capsule Take 1 capsule (500 mg total) by mouth 3 (three) times daily. 06/16/15   Hope Orlene Och, NP  benzonatate (TESSALON) 100 MG capsule Take 1 capsule (100 mg total) by mouth every 8 (eight) hours. 06/16/15   Hope Orlene Och, NP  erythromycin ophthalmic ointment Place a 1/2 inch ribbon of ointment BID into the lower eyelid for 7 days 05/12/15   Marlon Pel, PA-C  ibuprofen (ADVIL,MOTRIN) 800 MG tablet Take 1 tablet (800 mg total) by mouth 3 (three) times daily. 10/28/15   Solomia Harrell, PA-C  lidocaine (XYLOCAINE) 2 % solution Use as directed 20 mLs in the mouth or throat as needed for mouth pain. 08/05/15   Hanna Patel-Mills, PA-C   BP 130/74 mmHg  Pulse 89  Temp(Src) 98.4 F (36.9 C) (Oral)  Resp 16  Ht  (1.727 m)  Wt 112.124 kg  BMI 37.59 kg/m2  SpO2 100% Physical Exam  Constitutional: She appears well-developed and well-nourished. No distress.  HENT:  Head: Normocephalic and atraumatic.  Mouth/Throat: Oropharynx is clear and moist. No oropharyngeal exudate.  Eyes: Conjunctivae are normal.  Neck: Normal range of motion. Neck supple.  Full ROM without pain  Cardiovascular: Normal  rate, regular rhythm, normal heart sounds and intact distal pulses.   Pulmonary/Chest: Effort normal and breath sounds normal. No respiratory distress. She has no wheezes.  Abdominal: Soft. She exhibits no distension. There is no tenderness.  Musculoskeletal:  Full range of motion of the T-spine and L-spine No tenderness to palpation of the spinous processes of the T-spine or L-spine Mild tenderness to palpation of the bilateral paraspinous muscles  of the L-spine  Lymphadenopathy:    She has no cervical adenopathy.  Neurological: She is alert. She has normal reflexes.  Reflex Scores:      Bicep reflexes are 2+ on the right side and 2+ on the left side.      Brachioradialis reflexes are 2+ on the right side and 2+ on the left side.      Patellar reflexes are 2+ on the right side and 2+ on the left side.      Achilles reflexes are 2+ on the right side and 2+ on the left side. Speech is clear and goal oriented, follows commands Normal 5/5 strength in upper and lower extremities bilaterally including dorsiflexion and plantar flexion, strong and equal grip strength Sensation normal to light and sharp touch Moves extremities without ataxia, coordination intact Normal gait Normal balance No Clonus   Skin: Skin is warm and dry. No rash noted. She is not diaphoretic. No erythema.  Psychiatric: She has a normal mood and affect. Her behavior is normal.  Nursing note and vitals reviewed.   ED Course  Procedures (including critical care time)   MDM   Final diagnoses:  Bilateral low back pain without sciatica  Lumbar strain, initial encounter   Amanda Shields presents with low back pain.  No neurological deficits and normal neuro exam.  Patient can walk without difficulty or gait disturbance.  No loss of bowel or bladder control.  No concern for cauda equina.  No fever, night sweats, weight loss, h/o cancer, IVDU.  RICE protocol and ibuprofen indicated and discussed with patient.    Dahlia Client Dolton Shaker, PA-C 10/28/15 1610  Rolan Bucco, MD 10/28/15 2239

## 2016-01-06 ENCOUNTER — Emergency Department (HOSPITAL_COMMUNITY)
Admission: EM | Admit: 2016-01-06 | Discharge: 2016-01-06 | Disposition: A | Discharge status: Home / Self Care | Payer: Medicaid Other | Attending: Emergency Medicine | Admitting: Emergency Medicine

## 2016-01-06 ENCOUNTER — Encounter (HOSPITAL_COMMUNITY): Payer: Self-pay

## 2016-01-06 DIAGNOSIS — Z791 Long term (current) use of non-steroidal anti-inflammatories (NSAID): Secondary | ICD-10-CM | POA: Insufficient documentation

## 2016-01-06 DIAGNOSIS — J45909 Unspecified asthma, uncomplicated: Secondary | ICD-10-CM | POA: Insufficient documentation

## 2016-01-06 DIAGNOSIS — F17200 Nicotine dependence, unspecified, uncomplicated: Secondary | ICD-10-CM | POA: Insufficient documentation

## 2016-01-06 DIAGNOSIS — Z792 Long term (current) use of antibiotics: Secondary | ICD-10-CM | POA: Insufficient documentation

## 2016-01-06 DIAGNOSIS — N611 Abscess of the breast and nipple: Secondary | ICD-10-CM | POA: Insufficient documentation

## 2016-01-06 DIAGNOSIS — Z79899 Other long term (current) drug therapy: Secondary | ICD-10-CM | POA: Insufficient documentation

## 2016-01-06 MED ORDER — SULFAMETHOXAZOLE-TRIMETHOPRIM 800-160 MG PO TABS: 1.0000 | ORAL_TABLET | Freq: Two times a day (BID) | ORAL | Status: DC

## 2016-01-06 MED ORDER — SULFAMETHOXAZOLE-TRIMETHOPRIM 800-160 MG PO TABS
1.0000 | ORAL_TABLET | Freq: Once | ORAL | Status: AC
  Administered 2016-01-06: 1 via ORAL
  Filled 2016-01-06: qty 1

## 2016-01-06 MED ORDER — FLUCONAZOLE 150 MG PO TABS: 150.0000 mg | ORAL_TABLET | Freq: Every day | ORAL | Status: DC

## 2016-01-06 NOTE — ED Notes (Signed)
Pt reports abscess under left breast that she noticed last night with mild drainage.

## 2016-01-06 NOTE — ED Provider Notes (Signed)
CSN: [id number]     Arrival date & time 01/06/16  1539 History  By signing my name below, I, Freida Busmaniana Omoyeni, attest that this documentation has been prepared under the direction and in the presence of non-physician practitioner, Wynetta EmeryNicole Stacye Noori, PA-C. Electronically Signed: Freida Busmaniana Omoyeni, Scribe. 01/06/2016. 5:01 PM.    Chief Complaint  Patient presents with  . Abscess   The history is provided by the patient. No language interpreter was used.   HPI Comments:  Gwynneth AlbrightJasmine Degroat is a 20 y.o. female who presents to the Emergency Department complaining of boil under her right breast noticed yesterday When she removed her bra there was blood and pus on the bra. She squeezed the site and more blood and pus drained. She then cleaned the wound with peroxide and applied neosporin. She notes the size has reduced since. Pt has no other complaints or symptoms at this time.   LNMP 12/27/15  Past Medical History  Diagnosis Date  . Asthma    Past Surgical History  Procedure Laterality Date  . Wisdom tooth extraction     No family history on file. Social History  Substance Use Topics  . Smoking status: Current Some Day Smoker  . Smokeless tobacco: None  . Alcohol Use: No   OB History    No data available     Review of Systems  10 systems reviewed and all are negative for acute change except as noted in the HPI.  Allergies  Review of patient's allergies indicates no known allergies.  Home Medications   Prior to Admission medications   Medication Sig Start Date End Date Taking? Authorizing Provider  amoxicillin (AMOXIL) 500 MG capsule Take 1 capsule (500 mg total) by mouth 3 (three) times daily. 06/16/15   Hope Orlene OchM Neese, NP  benzonatate (TESSALON) 100 MG capsule Take 1 capsule (100 mg total) by mouth every 8 (eight) hours. 06/16/15   Hope Orlene OchM Neese, NP  erythromycin ophthalmic ointment Place a 1/2 inch ribbon of ointment BID into the lower eyelid for 7 days 05/12/15   Marlon Peliffany Greene, PA-C   ibuprofen (ADVIL,MOTRIN) 800 MG tablet Take 1 tablet (800 mg total) by mouth 3 (three) times daily. 10/28/15   Hannah Muthersbaugh, PA-C  lidocaine (XYLOCAINE) 2 % solution Use as directed 20 mLs in the mouth or throat as needed for mouth pain. 08/05/15   Hanna Patel-Mills, PA-C   BP 147/86 mmHg  Pulse 80  Temp(Src) 98.4 F (36.9 C) (Oral)  Resp 16  SpO2 99%  LMP 11/05/2015 Physical Exam  Constitutional: She is oriented to person, place, and time. She appears well-developed and well-nourished. No distress.  HENT:  Head: Normocephalic and atraumatic.  Eyes: Conjunctivae and EOM are normal.  Cardiovascular: Normal rate.   Pulmonary/Chest: Effort normal. No stridor.  Abdominal: She exhibits no distension.  Musculoskeletal: Normal range of motion.  Neurological: She is alert and oriented to person, place, and time.  Skin: Skin is warm and dry. Rash noted.  1 cm nonfluctuant abscess with no warmth, discharge, tenderness to palpation. It appears that there has been a central incision that is draining the abscess  Psychiatric: She has a normal mood and affect.  Nursing note and vitals reviewed.   ED Course  Procedures  DIAGNOSTIC STUDIES:  Oxygen Saturation is 99% on RA, normal by my interpretation.    COORDINATION OF CARE:  5:01 PM Discussed treatment plan with pt at bedside and pt agreed to plan.  Labs Review Labs Reviewed - No data to  display  Imaging Review No results found. I have personally reviewed and evaluated these images and lab results as part of my medical decision-making.   MDM   Final diagnoses:  None    Filed Vitals:   01/06/16 1548  BP: 147/86  Pulse: 80  Temp: 98.4 F (36.9 C)  TempSrc: Oral  Resp: 16  SpO2: 99%    Medications  sulfamethoxazole-trimethoprim (BACTRIM DS,SEPTRA DS) 800-160 MG per tablet 1 tablet (1 tablet Oral Given 01/06/16 1719)    Carollyn Etcheverry is 20 y.o. female presenting with Abscess to breast, it opened and has largely  resolved. Counseled patient on wound care and return precautions, will provide short course of Bactrim.  Evaluation does not show pathology that would require ongoing emergent intervention or inpatient treatment. Pt is hemodynamically stable and mentating appropriately. Discussed findings and plan with patient/guardian, who agrees with care plan. All questions answered. Return precautions discussed and outpatient follow up given.   Discharge Medication List as of 01/06/2016  5:27 PM    START taking these medications   Details  fluconazole (DIFLUCAN) 150 MG tablet Take 1 tablet (150 mg total) by mouth daily. Can repeat in 3 days if symptoms persist, Starting 01/06/2016, Until Discontinued, Print    sulfamethoxazole-trimethoprim (BACTRIM DS) 800-160 MG tablet Take 1 tablet by mouth 2 (two) times daily., Starting 01/06/2016, Until Discontinued, Print         I personally performed the services described in this documentation, which was scribed in my presence. The recorded information has been reviewed and is accurate.    Wynetta Emery, PA-C 01/06/16 1855  Gerhard Munch, MD 01/07/16 2059

## 2016-01-06 NOTE — Discharge Instructions (Signed)
Wash the affected area with soap and water and apply a thin layer of topical antibiotic ointment. Do this every 12 hours.   Do not use rubbing alcohol or hydrogen peroxide.                        Look for signs of infection: if you see redness, if the area becomes warm, if pain increases sharply, there is discharge (pus), if red streaks appear or you develop fever or vomiting, RETURN immediately to the Emergency Department  for a recheck.   Do not hesitate to return to the emergency room for any new, worsening or concerning symptoms.  Please obtain primary care using resource guide below. Let them know that you were seen in the emergency room and that they will need to obtain records for further outpatient management.

## 2016-02-23 ENCOUNTER — Encounter (HOSPITAL_COMMUNITY): Payer: Self-pay | Admitting: Emergency Medicine

## 2016-02-23 ENCOUNTER — Emergency Department (HOSPITAL_COMMUNITY)
Admission: EM | Admit: 2016-02-23 | Discharge: 2016-02-23 | Disposition: A | Discharge status: Home / Self Care | Payer: Medicaid Other | Attending: Emergency Medicine | Admitting: Emergency Medicine

## 2016-02-23 DIAGNOSIS — J45909 Unspecified asthma, uncomplicated: Secondary | ICD-10-CM | POA: Insufficient documentation

## 2016-02-23 DIAGNOSIS — K0889 Other specified disorders of teeth and supporting structures: Secondary | ICD-10-CM | POA: Diagnosis not present

## 2016-02-23 DIAGNOSIS — Z79899 Other long term (current) drug therapy: Secondary | ICD-10-CM | POA: Diagnosis not present

## 2016-02-23 DIAGNOSIS — F17210 Nicotine dependence, cigarettes, uncomplicated: Secondary | ICD-10-CM | POA: Diagnosis not present

## 2016-02-23 DIAGNOSIS — Z791 Long term (current) use of non-steroidal anti-inflammatories (NSAID): Secondary | ICD-10-CM | POA: Insufficient documentation

## 2016-02-23 MED ORDER — AMOXICILLIN 500 MG PO CAPS: 500.0000 mg | ORAL_CAPSULE | Freq: Three times a day (TID) | ORAL | Status: DC

## 2016-02-23 MED ORDER — IBUPROFEN 600 MG PO TABS: 600.0000 mg | ORAL_TABLET | Freq: Four times a day (QID) | ORAL | Status: DC | PRN

## 2016-02-23 NOTE — Discharge Instructions (Signed)
Ibuprofen for pain. Amoxil for infection until all gone. See resources below to follow up with a dentist.   [employer] 211 is a great source of information about community services available.  Access by dialing 2-1-1 from anywhere in [place], or by website -  PooledIncome.pl.   Other Local Resources (Updated 09/2015)  Dental  Care   Services    Phone Number and Address  Cost   [facility] For children 15 - 66 years of age:   Cleaning  Tooth brushing/flossing instruction  Sealants, fillings, crowns  Extractions  Emergency treatment  [phone] [address] [place], [place] [address] Charges based on family income.  Medicaid and some insurance plans accepted.     [facility] - [facility], fillings, crowns  Extractions  Emergency treatment [phone] [place], [place]  Pregnant women 72 years of age or older with a Medicaid card  [facility] [place]  Cleaning  Sealants, fillings, crowns  Extractions  Emergency treatment [phone] [address] [place], [place] Pregnant women 35 years of age or older with a Medicaid card  [facility] of [facility] - [facility] For children 53 - 46 years of age:   Cleaning  Tooth brushing/flossing instruction  Sealants, fillings, crowns  Extractions  Emergency treatment Limited orthodontic services for patients with Medicaid [phone] [place], [place] [address] Medicaid and [facility] Health Choice cover for children up to age 96 and pregnant women.  Parents of children up to age 67 without Medicaid pay a reduced fee at time of service.  [facility] Department of [employer] For children 57 - 18 years of age:   Cleaning  Tooth brushing/flossing instruction  Sealants, fillings,  crowns  Extractions  Emergency treatment Limited orthodontic services for patients with Medicaid [phone] [address] [place], [place].  Medicaid and [place] Health Choice cover for children up to age 32 and pregnant women.  Parents of children up to age 20 without Medicaid pay a reduced fee.  [facility]  Sealants, fillings, crowns  Extractions  Hours: Tuesdays and Thursdays, 4:15 - 8 pm [phone] [address] [place], [place] [address] Services free of charge to [facility] residents ages 18-64 who do not have health insurance, Medicare, [place], or [place] benefits and fall within federal poverty guidelines  [employer]    Provides dental care in addition to primary medical care, nutritional counseling, and pharmacy:  Nurse, mental health, fillings, crowns  Extractions                  [phone] [facility], [address] [place], [place]  [phone] [facility], [address] [place], [place]  [phone] [facility] [place], [place]  [phone] [facility], [address] [place], [place]  [phone] [facility] [address] [place], [place] Accepts [place], [place], most insurance.  Also provides services available to all with fees adjusted based on ability to pay.    [facility]  Cleaning  Tooth brushing/flossing instruction  Sealants, fillings, crowns  Extractions  Emergency treatment Hours: Tuesdays, Thursdays, and Fridays from 8 am to 5 pm by appointment only. [phone] [address] [place], [place]  [phone] [facility] residents with Medicaid (depending on eligibility) and children with [facility] Health Choice - call for more information.  [facility]  Extractions only  Hours: 2nd and  4th Thursday of each month from 6:30 am - 9 am.   [phone]. [phone] [place], [place] [phone] Ages 2318 and older only.  Patients are seen on a first come, first served basis.  [employer]  [employer]  Fillings  Extractions  Orthodontics  Endodontics  Implants/Crowns/Bridges  Complete and partial dentures [phone] 126 [address] [place], [place] Patients must complete an application for services.  There is often a waiting list.

## 2016-02-23 NOTE — ED Notes (Signed)
Patient states dental pain in R lower back gum area.   Patient states "It's in the same area that where my wisdom tooth was.  Something came out, it was skinny and hard like bone, then the swelling got worse".    Patient denies other symptoms.

## 2016-02-23 NOTE — ED Provider Notes (Signed)
CSN: [id number]     Arrival date & time 02/23/16  0932 History  By signing my name below, I, Rosario AdieWilliam Andrew Hiatt, attest that this documentation has been prepared under the direction and in the presence of Hema Lanza, PA-C.   Electronically Signed: Rosario AdieWilliam Andrew Hiatt, ED Scribe. 02/23/2016. 10:15 AM.  Chief Complaint  Patient presents with  . Dental Pain   The history is provided by the patient. No language interpreter was used.   HPI Comments: Amanda Shields is a 20 y.o. female who presents to the Emergency Department complaining of gradual onset, gradually worsening, constant, aching, 5/10 R upper and lower gingival pain onset 2 days ago. She has associated edema to the area of pain. Her pain is exacerbated with chewing and opening her mouth widely. No noted OTC medications or home remedies tried PTA. She reports that an object described as skinny, white, and "hard like bone" came out of the area, and then the area began to swell. She is currently followed by a dentist, but states that she is in college and cannot see him because of the distance. Pt denies fever.   Past Medical History  Diagnosis Date  . Asthma    Past Surgical History  Procedure Laterality Date  . Wisdom tooth extraction     No family history on file. Social History  Substance Use Topics  . Smoking status: Current Some Day Smoker    Types: Cigarettes  . Smokeless tobacco: None  . Alcohol Use: No   OB History    No data available     Review of Systems  Constitutional: Negative for fever.  HENT: Positive for dental problem.    Allergies  Review of patient's allergies indicates no known allergies.  Home Medications   Prior to Admission medications   Medication Sig Start Date End Date Taking? Authorizing Provider  amoxicillin (AMOXIL) 500 MG capsule Take 1 capsule (500 mg total) by mouth 3 (three) times daily. 06/16/15   Hope Orlene OchM Neese, NP  benzonatate (TESSALON) 100 MG capsule Take 1 capsule  (100 mg total) by mouth every 8 (eight) hours. 06/16/15   Hope Orlene OchM Neese, NP  erythromycin ophthalmic ointment Place a 1/2 inch ribbon of ointment BID into the lower eyelid for 7 days 05/12/15   Marlon Peliffany Greene, PA-C  fluconazole (DIFLUCAN) 150 MG tablet Take 1 tablet (150 mg total) by mouth daily. Can repeat in 3 days if symptoms persist 01/06/16   Joni ReiningNicole Pisciotta, PA-C  ibuprofen (ADVIL,MOTRIN) 800 MG tablet Take 1 tablet (800 mg total) by mouth 3 (three) times daily. 10/28/15   Hannah Muthersbaugh, PA-C  lidocaine (XYLOCAINE) 2 % solution Use as directed 20 mLs in the mouth or throat as needed for mouth pain. 08/05/15   Hanna Patel-Mills, PA-C  sulfamethoxazole-trimethoprim (BACTRIM DS) 800-160 MG tablet Take 1 tablet by mouth 2 (two) times daily. 01/06/16   Nicole Pisciotta, PA-C   BP 131/67 mmHg  Pulse 74  Temp(Src) 98.7 F (37.1 C) (Oral)  Resp 20  Ht 5\' 8"  (1.727 m)  Wt 247 lb (112.038 kg)  BMI 37.56 kg/m2  SpO2 100%  LMP 02/08/2016   Physical Exam  Constitutional: She appears well-developed and well-nourished.  HENT:  Head: Normocephalic.  No obvious facial swelling. No obvious dental caries. Gum and dentition appears to be normal. No obvious dental abscesses. Tender to palpation over right lower second molar. No swelling under the tongue. No trismus.  Eyes: Conjunctivae are normal.  Neck: Neck supple.  Cardiovascular: Normal  rate.   Pulmonary/Chest: Effort normal. No respiratory distress.  Abdominal: She exhibits no distension.  Musculoskeletal: Normal range of motion.  Lymphadenopathy:    She has no cervical adenopathy.  Neurological: She is alert.  Skin: Skin is warm and dry.  Psychiatric: She has a normal mood and affect. Her behavior is normal.  Nursing note and vitals reviewed.  ED Course  Procedures (including critical care time)  DIAGNOSTIC STUDIES: Oxygen Saturation is 100% on RA, normal by my interpretation.   COORDINATION OF CARE: 10:12 AM-Discussed next steps  with pt including f/u with a local dental specialist and antibiotics. Pt verbalized understanding and is agreeable with the plan.   MDM   Final diagnoses:  Pain, dental   Patient with right lower tooth pain, no obvious dental infection noted on exam, however given increased pain and tenderness to palpation over the tooth will start her on amoxicillin for possible early infection. Patient will be given a resource guide to follow-up with a dentist. At this time no evidence of Ludwig's angina. Patient's vital signs are stable she is stable for discharge home.  Filed Vitals:   02/23/16 0938  BP: 131/67  Pulse: 74  Temp: 98.7 F (37.1 C)  Resp: 20   I personally performed the services described in this documentation, which was scribed in my presence. The recorded information has been reviewed and is accurate.   Jaynie Crumble, PA-C 02/23/16 1649  Rolland Porter, MD 03/01/16 1742

## 2016-02-29 ENCOUNTER — Encounter (HOSPITAL_COMMUNITY): Payer: Self-pay

## 2016-02-29 ENCOUNTER — Emergency Department (HOSPITAL_COMMUNITY)
Admission: EM | Admit: 2016-02-29 | Discharge: 2016-02-29 | Disposition: A | Discharge status: Home / Self Care | Payer: Medicaid Other | Attending: Emergency Medicine | Admitting: Emergency Medicine

## 2016-02-29 DIAGNOSIS — Z79899 Other long term (current) drug therapy: Secondary | ICD-10-CM | POA: Insufficient documentation

## 2016-02-29 DIAGNOSIS — J45909 Unspecified asthma, uncomplicated: Secondary | ICD-10-CM | POA: Diagnosis not present

## 2016-02-29 DIAGNOSIS — J029 Acute pharyngitis, unspecified: Secondary | ICD-10-CM | POA: Diagnosis present

## 2016-02-29 DIAGNOSIS — F17210 Nicotine dependence, cigarettes, uncomplicated: Secondary | ICD-10-CM | POA: Insufficient documentation

## 2016-02-29 LAB — RAPID STREP SCREEN (MED CTR MEBANE ONLY): Streptococcus, Group A Screen (Direct): NEGATIVE

## 2016-02-29 MED ORDER — IBUPROFEN 400 MG PO TABS
800.0000 mg | ORAL_TABLET | Freq: Once | ORAL | Status: AC
  Administered 2016-02-29: 800 mg via ORAL
  Filled 2016-02-29: qty 2

## 2016-02-29 MED ORDER — DEXAMETHASONE SODIUM PHOSPHATE 10 MG/ML IJ SOLN
10.0000 mg | Freq: Once | INTRAMUSCULAR | Status: AC
  Administered 2016-02-29: 10 mg via INTRAMUSCULAR
  Filled 2016-02-29: qty 1

## 2016-02-29 MED ORDER — FLUCONAZOLE 150 MG PO TABS: 150.0000 mg | ORAL_TABLET | Freq: Every day | ORAL | Status: DC

## 2016-02-29 MED ORDER — AMOXICILLIN-POT CLAVULANATE 875-125 MG PO TABS: 1.0000 | ORAL_TABLET | Freq: Two times a day (BID) | ORAL | Status: DC

## 2016-02-29 NOTE — Discharge Instructions (Signed)
Amanda Shields,  Nice meeting you! Please follow-up with Amanda Shields. Return to the emergency department if you develop fevers, chills, drooling, inability to swallow, increased pain, new/worsening symptoms. Feel better soon!  S. Lane HackerNicole Real Cona, PA-C Pharyngitis Pharyngitis is redness, pain, and swelling (inflammation) of your pharynx.  CAUSES  Pharyngitis is usually caused by infection. Most of the time, these infections are from viruses (viral) and are part of a cold. However, sometimes pharyngitis is caused by bacteria (bacterial). Pharyngitis can also be caused by allergies. Viral pharyngitis may be spread from person to person by coughing, sneezing, and personal items or utensils (cups, forks, spoons, toothbrushes). Bacterial pharyngitis may be spread from person to person by more intimate contact, such as kissing.  SIGNS AND SYMPTOMS  Symptoms of pharyngitis include:   Sore Shields.   Tiredness (fatigue).   Low-grade fever.   Headache.  Joint pain and muscle aches.  Skin rashes.  Swollen lymph nodes.  Plaque-like film on Shields or tonsils (often seen with bacterial pharyngitis). DIAGNOSIS  Your health care provider will ask you questions about your illness and your symptoms. Your medical history, along with a physical exam, is often all that is needed to diagnose pharyngitis. Sometimes, a rapid strep test is done. Other lab tests may also be done, depending on the suspected cause.  TREATMENT  Viral pharyngitis will usually get better in 3-4 days without the use of medicine. Bacterial pharyngitis is treated with medicines that kill germs (antibiotics).  HOME CARE INSTRUCTIONS   Drink enough water and fluids to keep your urine clear or pale yellow.   Only take over-the-counter or prescription medicines as directed by your health care provider:   If you are prescribed antibiotics, make sure you finish them even if you start to feel better.   Do not take  aspirin.   Get lots of rest.   Gargle with 8 oz of salt water ( tsp of salt per 1 qt of water) as often as every 1-2 hours to soothe your Shields.   Shields lozenges (if you are not at risk for choking) or sprays may be used to soothe your Shields. SEEK MEDICAL CARE IF:   You have large, tender lumps in your neck.  You have a rash.  You cough up green, yellow-brown, or bloody spit. SEEK IMMEDIATE MEDICAL CARE IF:   Your neck becomes stiff.  You drool or are unable to swallow liquids.  You vomit or are unable to keep medicines or liquids down.  You have severe pain that does not go away with the use of recommended medicines.  You have trouble breathing (not caused by a stuffy nose). MAKE SURE YOU:   Understand these instructions.  Will watch your condition.  Will get help right away if you are not doing well or get worse.   This information is not intended to replace advice given to you by your health care provider. Make sure you discuss any questions you have with your health care provider.   Document Released: 08/21/2005 Document Revised: 06/11/2013 Document Reviewed: 04/28/2013 Elsevier Interactive Patient Education [employer] Elsevier Inc.

## 2016-02-29 NOTE — ED Notes (Signed)
Per Pt, Pt reports having sore throat for three days. Pt reports it is painful to swallow, tonsils swelling, and white patches in her throat. Pt is in no acute distress at this time. Reports pain has started to go to her right ear. Denies fevers

## 2016-02-29 NOTE — ED Provider Notes (Signed)
CSN: [id number]     Arrival date & time 02/29/16  1551 History  By signing my name below, I, Soijett Blue, attest that this documentation has been prepared under the direction and in the presence of S. Lane HackerNicole Fadia Marlar, PA-C Electronically Signed: Soijett Blue, ED Scribe. 02/29/2016. 5:53 PM.    Chief Complaint  Patient presents with  . Sore Throat      The history is provided by the patient. No language interpreter was used.    HPI Comments: Amanda Shields is a 20 y.o. female who presents to the Emergency Department complaining of sore throat onset 3 days. Pt reports that her sore throat is worsened with swallowing. Denies alleviating factors. Pt notes that she was seen in the ED several days ago for dental pain and Rx amoxil that she hasn't filled yet. Pt states that she has had 3 different episodes of tonsillar swelling within the past two months. She states that she is having associated symptoms of painful swallowing. She states that she has tried OTC 800 mg ibuprofen once daily with no relief for her symptoms. She denies drooling, cough, fever, and any other symptoms. Pt is an occasional cigarette smoker.   Per pt chart review: Pt was seen in the ED on 02/23/2016 for right upper and lower dental pain. Pt was Rx Amoxil for their symptoms.   Past Medical History  Diagnosis Date  . Asthma    Past Surgical History  Procedure Laterality Date  . Wisdom tooth extraction    . Laparoscopic ovarian     No family history on file. Social History  Substance Use Topics  . Smoking status: Current Some Day Smoker    Types: Cigarettes  . Smokeless tobacco: None  . Alcohol Use: No   OB History    No data available     Review of Systems  A complete 10 system review of systems was obtained and all systems are negative except as noted in the HPI and PMH.   Allergies  Review of patient's allergies indicates no known allergies.  Home Medications   Prior to Admission medications    Medication Sig Start Date End Date Taking? Authorizing Provider  amoxicillin (AMOXIL) 500 MG capsule Take 1 capsule (500 mg total) by mouth 3 (three) times daily. 06/16/15   Hope Orlene OchM Neese, NP  amoxicillin (AMOXIL) 500 MG capsule Take 1 capsule (500 mg total) by mouth 3 (three) times daily. 02/23/16   Tatyana Kirichenko, PA-C  benzonatate (TESSALON) 100 MG capsule Take 1 capsule (100 mg total) by mouth every 8 (eight) hours. 06/16/15   Hope Orlene OchM Neese, NP  erythromycin ophthalmic ointment Place a 1/2 inch ribbon of ointment BID into the lower eyelid for 7 days 05/12/15   Marlon Peliffany Greene, PA-C  fluconazole (DIFLUCAN) 150 MG tablet Take 1 tablet (150 mg total) by mouth daily. Can repeat in 3 days if symptoms persist 01/06/16   Joni ReiningNicole Pisciotta, PA-C  ibuprofen (ADVIL,MOTRIN) 600 MG tablet Take 1 tablet (600 mg total) by mouth every 6 (six) hours as needed. 02/23/16   Tatyana Kirichenko, PA-C  ibuprofen (ADVIL,MOTRIN) 800 MG tablet Take 1 tablet (800 mg total) by mouth 3 (three) times daily. 10/28/15   Hannah Muthersbaugh, PA-C  lidocaine (XYLOCAINE) 2 % solution Use as directed 20 mLs in the mouth or throat as needed for mouth pain. 08/05/15   Hanna Patel-Mills, PA-C  sulfamethoxazole-trimethoprim (BACTRIM DS) 800-160 MG tablet Take 1 tablet by mouth 2 (two) times daily. 01/06/16   Wynetta EmeryNicole Pisciotta,  PA-C   BP 138/74 mmHg  Pulse 100  Temp(Src) 98.9 F (37.2 C)  Resp 20  Wt 243 lb 2 oz (110.281 kg)  SpO2 100%  LMP 02/08/2016 Physical Exam  Constitutional: She is oriented to person, place, and time. She appears well-developed and well-nourished. No distress.  HENT:  Head: Normocephalic and atraumatic.  Mouth/Throat: Uvula is midline. No uvula swelling. Posterior oropharyngeal edema present.  Bilateral tonsillar edema. No uvular edema or deviation. Bilateral tonsils with tonsilliths.   Eyes: EOM are normal.  Neck: Neck supple.  Cardiovascular: Normal rate.   Pulmonary/Chest: Effort normal. No respiratory  distress.  Abdominal: She exhibits no distension.  Musculoskeletal: Normal range of motion.  Neurological: She is alert and oriented to person, place, and time.  Skin: Skin is warm and dry.  Psychiatric: She has a normal mood and affect. Her behavior is normal.  Nursing note and vitals reviewed.   ED Course  Procedures  DIAGNOSTIC STUDIES: Oxygen Saturation is 100% on RA, nl by my interpretation.    COORDINATION OF CARE: 5:52 PM Discussed treatment plan with pt at bedside which includes rapid strep screen and culture, decadron injection, ibuprofen, referral to ENT specialist, augmentin Rx, and pt agreed to plan.    Labs Review Labs Reviewed  [facility]NOT [facility])  CULTURE, GROUP A STREP [facility]   I have personally reviewed and evaluated these lab results as part of my medical decision-making.    MDM   Final diagnoses:  Pharyngitis   Rapid strep negative but patient is a smoker with bilateral tonsillar edema and tonsilliths. Patient referred to ENT and given Rx for Augmentin. She requests diflucan for yeast infections. Patient may be safely discharged home. Discussed reasons for return.  Patient in understanding and agreement with the plan.   I personally performed the services described in this documentation, which was scribed in my presence. The recorded information has been reviewed and is accurate.   Melton KrebsSamantha Nicole Merlie Noga, PA-C 03/07/16 [id number]  Lavera Guiseana Duo Liu, MD 03/07/16 [phone]

## 2016-02-29 NOTE — ED Notes (Signed)
PA at bedside.

## 2016-03-03 LAB — CULTURE, GROUP A STREP (THRC)

## 2016-06-18 ENCOUNTER — Encounter (HOSPITAL_COMMUNITY): Payer: Self-pay

## 2016-06-18 ENCOUNTER — Emergency Department (HOSPITAL_COMMUNITY)
Admission: EM | Admit: 2016-06-18 | Discharge: 2016-06-18 | Disposition: A | Discharge status: Home / Self Care | Payer: BLUE CROSS/BLUE SHIELD | Attending: Emergency Medicine | Admitting: Emergency Medicine

## 2016-06-18 DIAGNOSIS — F17210 Nicotine dependence, cigarettes, uncomplicated: Secondary | ICD-10-CM | POA: Insufficient documentation

## 2016-06-18 DIAGNOSIS — J45909 Unspecified asthma, uncomplicated: Secondary | ICD-10-CM | POA: Diagnosis not present

## 2016-06-18 DIAGNOSIS — J029 Acute pharyngitis, unspecified: Secondary | ICD-10-CM | POA: Diagnosis present

## 2016-06-18 DIAGNOSIS — J020 Streptococcal pharyngitis: Secondary | ICD-10-CM | POA: Diagnosis not present

## 2016-06-18 DIAGNOSIS — J069 Acute upper respiratory infection, unspecified: Secondary | ICD-10-CM

## 2016-06-18 LAB — RAPID STREP SCREEN (MED CTR MEBANE ONLY): STREPTOCOCCUS, GROUP A SCREEN (DIRECT): NEGATIVE

## 2016-06-18 MED ORDER — CETIRIZINE HCL 10 MG PO TABS: 10.0000 mg | ORAL_TABLET | Freq: Every day | ORAL | 1 refills | Status: DC

## 2016-06-18 MED ORDER — ACETAMINOPHEN 325 MG PO TABS
ORAL_TABLET | ORAL | Status: AC
  Filled 2016-06-18: qty 2

## 2016-06-18 MED ORDER — FLUCONAZOLE 150 MG PO TABS: 150.0000 mg | ORAL_TABLET | Freq: Once | ORAL | 0 refills | Status: AC

## 2016-06-18 MED ORDER — ACETAMINOPHEN 325 MG PO TABS
650.0000 mg | ORAL_TABLET | Freq: Once | ORAL | Status: AC | PRN
Start: 2016-06-18 — End: 2016-06-18
  Administered 2016-06-18: 650 mg via ORAL

## 2016-06-18 MED ORDER — FLUTICASONE PROPIONATE 50 MCG/ACT NA SUSP: 2.0000 | Freq: Every day | NASAL | 0 refills | Status: DC

## 2016-06-18 MED ORDER — AMOXICILLIN 500 MG PO CAPS: 1000.0000 mg | ORAL_CAPSULE | Freq: Two times a day (BID) | ORAL | 0 refills | Status: DC

## 2016-06-18 MED ORDER — NAPROXEN 250 MG PO TABS: 250.0000 mg | ORAL_TABLET | Freq: Two times a day (BID) | ORAL | 0 refills | Status: DC

## 2016-06-18 NOTE — ED Provider Notes (Signed)
[facility]-EMERGENCY DEPT Provider Note  By signing my name below, I, Earmon PhoenixJennifer Waddell, attest that this documentation has been prepared under the direction and in the presence of Will Jimi Giza, PA-C. Electronically Signed: Earmon PhoenixJennifer Waddell, ED Scribe. 06/18/16. 8:02 PM.   History   Chief Complaint Chief Complaint  Patient presents with  . Sore Throat   The history is provided by the patient and medical records. No language interpreter was used.    HPI Comments:  Amanda Shields is an obese 20 y.o. female who presents to the Emergency Department complaining of sore throat that began about two days ago. She reports associated generalized body aches, sinus pressure, congestion, post nasal drip and cough. She reports subjective fever.  She has not taken anything to treat her symptoms. She denies modifying factors. She denies fever, chills, nausea, vomiting, difficulty swallowing,    Past Medical History:  Diagnosis Date  . Asthma     There are no active problems to display for this patient.   Past Surgical History:  Procedure Laterality Date  . LAPAROSCOPIC OVARIAN    . WISDOM TOOTH EXTRACTION      OB History    No data available       Home Medications    Prior to Admission medications   Medication Sig Start Date End Date Taking? Authorizing Provider  amoxicillin (AMOXIL) 500 MG capsule Take 2 capsules (1,000 mg total) by mouth 2 (two) times daily. 06/18/16   Everlene FarrierWilliam Wilhelmena Zea, PA-C  amoxicillin-clavulanate (AUGMENTIN) 875-125 MG tablet Take 1 tablet by mouth every 12 (twelve) hours. 02/29/16   Melton KrebsSamantha Nicole Riley, PA-C  benzonatate (TESSALON) 100 MG capsule Take 1 capsule (100 mg total) by mouth every 8 (eight) hours. 06/16/15   Hope Orlene OchM Neese, NP  cetirizine (ZYRTEC ALLERGY) 10 MG tablet Take 1 tablet (10 mg total) by mouth daily. 06/18/16   Everlene FarrierWilliam Korby Ratay, PA-C  erythromycin ophthalmic ointment Place a 1/2 inch ribbon of ointment BID into the lower eyelid for 7 days 05/12/15    Marlon Peliffany Greene, PA-C  fluconazole (DIFLUCAN) 150 MG tablet Take 1 tablet (150 mg total) by mouth once. 06/18/16 06/18/16  Everlene FarrierWilliam Carinne Brandenburger, PA-C  fluticasone (FLONASE) 50 MCG/ACT nasal spray Place 2 sprays into both nostrils daily. 06/18/16   Everlene FarrierWilliam Gracia Saggese, PA-C  ibuprofen (ADVIL,MOTRIN) 600 MG tablet Take 1 tablet (600 mg total) by mouth every 6 (six) hours as needed. 02/23/16   Tatyana Kirichenko, PA-C  ibuprofen (ADVIL,MOTRIN) 800 MG tablet Take 1 tablet (800 mg total) by mouth 3 (three) times daily. 10/28/15   Hannah Muthersbaugh, PA-C  lidocaine (XYLOCAINE) 2 % solution Use as directed 20 mLs in the mouth or throat as needed for mouth pain. 08/05/15   Hanna Patel-Mills, PA-C  naproxen (NAPROSYN) 250 MG tablet Take 1 tablet (250 mg total) by mouth 2 (two) times daily with a meal. 06/18/16   Everlene FarrierWilliam Shemicka Cohrs, PA-C  sulfamethoxazole-trimethoprim (BACTRIM DS) 800-160 MG tablet Take 1 tablet by mouth 2 (two) times daily. 01/06/16   Joni ReiningNicole Pisciotta, PA-C    Family History History reviewed. No pertinent family history.  Social History Social History  Substance Use Topics  . Smoking status: Current Some Day Smoker    Types: Cigarettes  . Smokeless tobacco: Never Used  . Alcohol use No     Allergies   Review of patient's allergies indicates no known allergies.   Review of Systems Review of Systems  Constitutional: Negative for chills and fever.  HENT: Positive for congestion, postnasal drip, sinus pressure and sore  throat. Negative for drooling, facial swelling, mouth sores, trouble swallowing and voice change.   Eyes: Negative for visual disturbance.  Respiratory: Positive for cough. Negative for shortness of breath.   Cardiovascular: Negative for chest pain.  Gastrointestinal: Negative for abdominal pain, nausea and vomiting.  Musculoskeletal: Positive for myalgias. Negative for neck pain.  Skin: Negative for rash.     Physical Exam Updated Vital Signs BP 149/100 (BP Location: Left  Arm)   Pulse 115   Temp 100.6 F (38.1 C) (Oral)   Resp 18   LMP 05/19/2016 (Exact Date)   SpO2 98%   Physical Exam  Constitutional: She appears well-developed and well-nourished. No distress.  Nontoxic appearing.  HENT:  Head: Normocephalic and atraumatic.  Right Ear: External ear normal. Tympanic membrane is not erythematous. A middle ear effusion is present.  Left Ear: External ear normal. Tympanic membrane is not erythematous. A middle ear effusion is present.  Mouth/Throat: Uvula is midline and mucous membranes are normal. No trismus in the jaw. No uvula swelling. Oropharyngeal exudate present. No tonsillar abscesses. Tonsillar exudate.  Mild bilateral mid ear effusion bilaterally. No erythema or loss of landmarks. Bilateral tonsillar hypertrophy with exudates. Left greater than right. Uvula midline. No PTA. No trismus. No drooling.  Eyes: Conjunctivae are normal. Pupils are equal, round, and reactive to light. Right eye exhibits no discharge. Left eye exhibits no discharge.  Neck: Normal range of motion. Neck supple. No JVD present. No tracheal deviation present.  Mild cervical lymphadenopathy  Cardiovascular: Normal rate, regular rhythm, normal heart sounds and intact distal pulses.   Pulmonary/Chest: Effort normal and breath sounds normal. No stridor. No respiratory distress. She has no wheezes. She has no rales.  Lungs clear to auscultation bilaterally.  Abdominal: Soft. There is no tenderness.  Lymphadenopathy:    She has cervical adenopathy.  Neurological: She is alert. Coordination normal.  Skin: Skin is warm and dry. Capillary refill takes less than 2 seconds. No rash noted. She is not diaphoretic. No erythema. No pallor.  Psychiatric: She has a normal mood and affect. Her behavior is normal.  Nursing note and vitals reviewed.    ED Treatments / Results  DIAGNOSTIC STUDIES: Oxygen Saturation is 98% on RA, normal by my interpretation.   COORDINATION OF CARE: 8:01  PM- Will prescribe antibiotics and Diflucan per pt's request. Will prescribe Fluticasone nasal spray. Pt verbalizes understanding and agrees to plan.  Medications  acetaminophen (TYLENOL) 325 MG tablet (not administered)  acetaminophen (TYLENOL) tablet 650 mg (650 mg Oral Given 06/18/16 1821)   Labs (all labs ordered are listed, but only abnormal results are displayed) Labs Reviewed  RAPID STREP SCREEN (NOT AT [facility])  CULTURE, GROUP A STREP [facility])    EKG  EKG Interpretation None       Radiology No results found.  Procedures Procedures (including critical care time)  Medications Ordered in ED Medications  acetaminophen (TYLENOL) 325 MG tablet (not administered)  acetaminophen (TYLENOL) tablet 650 mg (650 mg Oral Given 06/18/16 1821)     Initial Impression / Assessment and Plan / ED Course  I have reviewed the triage vital signs and the nursing notes.  Pertinent labs & imaging results that were available during my care of the patient were reviewed by me and considered in my medical decision making (see chart for details).  Clinical Course   This is an obese 20 y.o. female who presents to the Emergency Department complaining of sore throat that began about two days ago. She reports  associated generalized body aches, sinus pressure, congestion, post nasal drip and cough. She reports subjective fever.  On exam the patient is afebrile nontoxic appearing. On arrival she did have a temperature of 100.6. She has moderate bilateral tonsillar hypertrophy with her left greater than right. There is tonsillar exudate. Uvula is midline without edema. No peritonsillar abscess. No trismus. No drooling. She is tolerating by mouth without difficulty. Lungs are clear to auscultation bilaterally. As we were waiting on strep to return we discussed she has a centor criteria score of 4. Plan is to start antibiotics. Then strep returned negative. Will still plan with amoxicillin and have her follow  up with ENT as she had strep last year as well. I advised the patient to follow-up with their primary care provider this week. I advised the patient to return to the emergency department with new or worsening symptoms or new concerns. The patient verbalized understanding and agreement with plan.     Final Clinical Impressions(s) / ED Diagnoses   Final diagnoses:  Strep pharyngitis  Upper respiratory tract infection, unspecified type    New Prescriptions New Prescriptions   AMOXICILLIN (AMOXIL) 500 MG CAPSULE    Take 2 capsules (1,000 mg total) by mouth 2 (two) times daily.   CETIRIZINE (ZYRTEC ALLERGY) 10 MG TABLET    Take 1 tablet (10 mg total) by mouth daily.   FLUCONAZOLE (DIFLUCAN) 150 MG TABLET    Take 1 tablet (150 mg total) by mouth once.   FLUTICASONE (FLONASE) 50 MCG/ACT NASAL SPRAY    Place 2 sprays into both nostrils daily.   NAPROXEN (NAPROSYN) 250 MG TABLET    Take 1 tablet (250 mg total) by mouth 2 (two) times daily with a meal.   I personally performed the services described in this documentation, which was scribed in my presence. The recorded information has been reviewed and is accurate.        Everlene Farrier, PA-C 06/18/16 2016    Charlynne Pander, MD 06/19/16 [phone]

## 2016-06-18 NOTE — ED Triage Notes (Signed)
Pt reports tonsillar inflammation and scratchy throat. She reports this is causing left ear pain as well. Tonsils appear swollen and covered in white patches.

## 2016-06-19 LAB — CULTURE, GROUP A STREP (THRC)

## 2016-07-11 ENCOUNTER — Encounter (HOSPITAL_COMMUNITY): Payer: Self-pay | Admitting: Emergency Medicine

## 2016-07-11 ENCOUNTER — Emergency Department (HOSPITAL_COMMUNITY)
Admission: EM | Admit: 2016-07-11 | Discharge: 2016-07-11 | Disposition: A | Discharge status: Home / Self Care | Payer: BLUE CROSS/BLUE SHIELD | Source: Home / Self Care | Attending: Emergency Medicine | Admitting: Emergency Medicine

## 2016-07-11 DIAGNOSIS — F17210 Nicotine dependence, cigarettes, uncomplicated: Secondary | ICD-10-CM | POA: Diagnosis not present

## 2016-07-11 DIAGNOSIS — L0501 Pilonidal cyst with abscess: Secondary | ICD-10-CM | POA: Diagnosis not present

## 2016-07-11 DIAGNOSIS — L0231 Cutaneous abscess of buttock: Secondary | ICD-10-CM | POA: Insufficient documentation

## 2016-07-11 DIAGNOSIS — Z5321 Procedure and treatment not carried out due to patient leaving prior to being seen by health care provider: Secondary | ICD-10-CM

## 2016-07-11 DIAGNOSIS — J45909 Unspecified asthma, uncomplicated: Secondary | ICD-10-CM | POA: Diagnosis not present

## 2016-07-11 NOTE — ED Notes (Signed)
Called patient 3x, no response. 

## 2016-07-11 NOTE — ED Triage Notes (Signed)
Pt states "theres something wrong with my butt, I cant sit flatter than sitting on my side. Right where my tailbone is its really red and swollen. Pt states its been happening for almost a week". Pt states she's had a cyst there before a year ago and was told it could come back. Cyst went down on its own.

## 2016-07-11 NOTE — ED Triage Notes (Signed)
Pt. reports worsening skin abscess at mid buttocks onset last week with redness/swelling , denies discharge , no fever or chills .

## 2016-07-12 ENCOUNTER — Emergency Department (HOSPITAL_COMMUNITY)
Admission: EM | Admit: 2016-07-12 | Discharge: 2016-07-12 | Disposition: A | Discharge status: Home / Self Care | Payer: BLUE CROSS/BLUE SHIELD | Attending: Emergency Medicine | Admitting: Emergency Medicine

## 2016-07-12 DIAGNOSIS — L0501 Pilonidal cyst with abscess: Secondary | ICD-10-CM

## 2016-07-12 MED ORDER — LIDOCAINE-EPINEPHRINE (PF) 2 %-1:200000 IJ SOLN
20.0000 mL | INTRAMUSCULAR | Status: AC
  Administered 2016-07-12: 20 mL
  Filled 2016-07-12: qty 20

## 2016-07-12 MED ORDER — TRAMADOL HCL 50 MG PO TABS: 50.0000 mg | ORAL_TABLET | Freq: Four times a day (QID) | ORAL | 0 refills | Status: DC | PRN

## 2016-07-12 MED ORDER — NAPROXEN 500 MG PO TABS: 500.0000 mg | ORAL_TABLET | Freq: Two times a day (BID) | ORAL | 0 refills | Status: DC

## 2016-07-12 MED ORDER — SULFAMETHOXAZOLE-TRIMETHOPRIM 800-160 MG PO TABS: 1.0000 | ORAL_TABLET | Freq: Two times a day (BID) | ORAL | 0 refills | Status: AC

## 2016-07-12 MED ORDER — HYDROCODONE-ACETAMINOPHEN 5-325 MG PO TABS
2.0000 | ORAL_TABLET | Freq: Once | ORAL | Status: AC
  Administered 2016-07-12: 2 via ORAL
  Filled 2016-07-12: qty 2

## 2016-07-12 NOTE — ED Provider Notes (Signed)
[facility]-EMERGENCY DEPT Provider Note   CSN: [id number] Arrival date & time: 07/11/16  2258    History   Chief Complaint Chief Complaint  Patient presents with  . Abscess    HPI Amanda Shields is a 20 y.o. female.  The history is provided by the patient. No language interpreter was used.  Abscess  Abscess location: buttock. Abscess quality: painful and redness   Abscess quality: not draining   Red streaking: no   Duration:  1 week Progression:  Worsening Pain details:    Quality:  Pressure   Severity:  Moderate   Timing:  Constant   Progression:  Worsening Chronicity:  Recurrent Context: not diabetes   Relieved by: warm water from shower. Exacerbated by: sitting. Associated symptoms: no fever   Risk factors: prior abscess   Risk factors comment:  States, "I had one a year ago, but it went down on its own".   Past Medical History:  Diagnosis Date  . Asthma     There are no active problems to display for this patient.   Past Surgical History:  Procedure Laterality Date  . LAPAROSCOPIC OVARIAN    . WISDOM TOOTH EXTRACTION      OB History    No data available       Home Medications    Prior to Admission medications   Medication Sig Start Date End Date Taking? Authorizing Provider  amoxicillin (AMOXIL) 500 MG capsule Take 2 capsules (1,000 mg total) by mouth 2 (two) times daily. Patient not taking: Reported on 07/12/2016 06/18/16   Everlene FarrierWilliam Dansie, PA-C  amoxicillin-clavulanate (AUGMENTIN) 875-125 MG tablet Take 1 tablet by mouth every 12 (twelve) hours. Patient not taking: Reported on 07/12/2016 02/29/16   Melton KrebsSamantha Nicole Riley, PA-C  benzonatate (TESSALON) 100 MG capsule Take 1 capsule (100 mg total) by mouth every 8 (eight) hours. Patient not taking: Reported on 07/12/2016 06/16/15   Janne NapoleonHope M Neese, NP  cetirizine (ZYRTEC ALLERGY) 10 MG tablet Take 1 tablet (10 mg total) by mouth daily. Patient not taking: Reported on 07/12/2016 06/18/16   Everlene FarrierWilliam Dansie, PA-C   erythromycin ophthalmic ointment Place a 1/2 inch ribbon of ointment BID into the lower eyelid for 7 days Patient not taking: Reported on 07/12/2016 05/12/15   Marlon Peliffany Greene, PA-C  fluticasone (FLONASE) 50 MCG/ACT nasal spray Place 2 sprays into both nostrils daily. Patient not taking: Reported on 07/12/2016 06/18/16   Everlene FarrierWilliam Dansie, PA-C  ibuprofen (ADVIL,MOTRIN) 600 MG tablet Take 1 tablet (600 mg total) by mouth every 6 (six) hours as needed. Patient not taking: Reported on 07/12/2016 02/23/16   Tatyana Kirichenko, PA-C  ibuprofen (ADVIL,MOTRIN) 800 MG tablet Take 1 tablet (800 mg total) by mouth 3 (three) times daily. Patient not taking: Reported on 07/12/2016 10/28/15   Dahlia ClientHannah Muthersbaugh, PA-C  lidocaine (XYLOCAINE) 2 % solution Use as directed 20 mLs in the mouth or throat as needed for mouth pain. Patient not taking: Reported on 07/12/2016 08/05/15   Catha GosselinHanna Patel-Mills, PA-C  naproxen (NAPROSYN) 500 MG tablet Take 1 tablet (500 mg total) by mouth 2 (two) times daily. 07/12/16   Antony MaduraKelly Song Myre, PA-C  sulfamethoxazole-trimethoprim (BACTRIM DS,SEPTRA DS) 800-160 MG tablet Take 1 tablet by mouth 2 (two) times daily. 07/12/16 07/19/16  Antony MaduraKelly Jurrell Royster, PA-C  traMADol (ULTRAM) 50 MG tablet Take 1 tablet (50 mg total) by mouth every 6 (six) hours as needed for severe pain. 07/12/16   Antony MaduraKelly Ermelinda Eckert, PA-C    Family History No family history on file.  Social History  Social History  Substance Use Topics  . Smoking status: Current Some Day Smoker    Types: Cigarettes  . Smokeless tobacco: Never Used  . Alcohol use No     Allergies   Patient has no known allergies.   Review of Systems Review of Systems  Constitutional: Negative for fever.  Ten systems reviewed and are negative for acute change, except as noted in the HPI.     Physical Exam Updated Vital Signs BP 152/91   Pulse 99   Temp 98.6 F (37 C) (Oral)   Resp 18   LMP 06/21/2016   SpO2 100%   Physical Exam  Constitutional: She is  oriented to person, place, and time. She appears well-developed and well-nourished. No distress.  Nontoxic and in NAD  HENT:  Head: Normocephalic and atraumatic.  Eyes: Conjunctivae and EOM are normal. No scleral icterus.  Neck: Normal range of motion.  Cardiovascular: Normal rate, regular rhythm and intact distal pulses.   Pulmonary/Chest: Effort normal. No respiratory distress.  Respirations even and unlabored  Genitourinary:     Musculoskeletal: Normal range of motion.  Neurological: She is alert and oriented to person, place, and time.  Skin: Skin is warm and dry. No rash noted. She is not diaphoretic. There is erythema. No pallor.  See GU exam  Psychiatric: She has a normal mood and affect. Her behavior is normal.  Nursing note and vitals reviewed.    ED Treatments / Results  Labs (all labs ordered are listed, but only abnormal results are displayed) Labs Reviewed - No data to display  EKG  EKG Interpretation None       Radiology No results found.  Procedures Procedures (including critical care time)  Medications Ordered in ED Medications  HYDROcodone-acetaminophen (NORCO/VICODIN) 5-325 MG per tablet 2 tablet (not administered)  lidocaine-EPINEPHrine (XYLOCAINE W/EPI) 2 %-1:200000 (PF) injection 20 mL (20 mLs Infiltration Given 07/12/16 0101)     Initial Impression / Assessment and Plan / ED Course  I have reviewed the triage vital signs and the nursing notes.  Pertinent labs & imaging results that were available during my care of the patient were reviewed by me and considered in my medical decision making (see chart for details).  Clinical Course     1:25 AM INCISION AND DRAINAGE Performed by: Antony MaduraHUMES, Jamyra Zweig Consent: Verbal consent obtained. Risks and benefits: risks, benefits and alternatives were discussed Type: abscess  Body area: R intergluteal cleft  Anesthesia: local infiltration  Incision was made with a scalpel.  Local anesthetic:  lidocaine 2% with epinephrine  Anesthetic total: 6 ml  Complexity: complex Blunt dissection to break up loculations  Drainage: purulent  Drainage amount: copious  Packing material: 1/4 in iodoform gauze  Patient tolerance: Patient tolerated the procedure well with no immediate complications.   1:42 AM Patient with skin abscess amenable to incision and drainage. Abscess packed in light of location to encourage further drainage; wound recheck in 2 days advised for packing removal. Encouraged home warm soaks and flushing. Mild signs of cellulitis is surrounding skin. Will d/c home on Bactrim to ensure resolution of symptoms. Patient agreeable to plan with no unaddressed concerns. Discharged in stable condition.    Final Clinical Impressions(s) / ED Diagnoses   Final diagnoses:  Pilonidal abscess    New Prescriptions New Prescriptions   NAPROXEN (NAPROSYN) 500 MG TABLET    Take 1 tablet (500 mg total) by mouth 2 (two) times daily.   SULFAMETHOXAZOLE-TRIMETHOPRIM (BACTRIM DS,SEPTRA DS) 800-160 MG TABLET  Take 1 tablet by mouth 2 (two) times daily.   TRAMADOL (ULTRAM) 50 MG TABLET    Take 1 tablet (50 mg total) by mouth every 6 (six) hours as needed for severe pain.     Antony Madura, PA-C 07/12/16 0142    Glynn Octave, MD 07/12/16 [phone]

## 2016-07-12 NOTE — Discharge Instructions (Signed)
Apply warm compresses 3-4 times per day to promote drainage. Take Bactrim as prescribed. You may take tramadol for pain that is not well-controlled with naproxen. Return in 2 days for packing removal and recheck of your abscess. You may return sooner for any new or concerning symptoms.

## 2016-07-14 ENCOUNTER — Emergency Department (HOSPITAL_COMMUNITY)
Admission: EM | Admit: 2016-07-14 | Discharge: 2016-07-14 | Disposition: A | Discharge status: Home / Self Care | Payer: BLUE CROSS/BLUE SHIELD | Attending: Emergency Medicine | Admitting: Emergency Medicine

## 2016-07-14 ENCOUNTER — Encounter (HOSPITAL_COMMUNITY): Payer: Self-pay

## 2016-07-14 DIAGNOSIS — Z4801 Encounter for change or removal of surgical wound dressing: Secondary | ICD-10-CM | POA: Insufficient documentation

## 2016-07-14 DIAGNOSIS — J45909 Unspecified asthma, uncomplicated: Secondary | ICD-10-CM | POA: Insufficient documentation

## 2016-07-14 DIAGNOSIS — F17210 Nicotine dependence, cigarettes, uncomplicated: Secondary | ICD-10-CM | POA: Insufficient documentation

## 2016-07-14 DIAGNOSIS — Z5189 Encounter for other specified aftercare: Secondary | ICD-10-CM

## 2016-07-14 DIAGNOSIS — L0501 Pilonidal cyst with abscess: Secondary | ICD-10-CM

## 2016-07-14 MED ORDER — FLUCONAZOLE 100 MG PO TABS
150.0000 mg | ORAL_TABLET | Freq: Once | ORAL | Status: AC
  Administered 2016-07-14: 150 mg via ORAL
  Filled 2016-07-14: qty 2

## 2016-07-14 MED ORDER — FLUCONAZOLE 150 MG PO TABS: 150.0000 mg | ORAL_TABLET | Freq: Once | ORAL | 0 refills | Status: AC

## 2016-07-14 NOTE — ED Provider Notes (Signed)
[facility] Provider Note   CSN: [id number] Arrival date & time: 07/14/16  1902     History   Chief Complaint Chief Complaint  Patient presents with  . Abscess    HPI Amanda Shields is a 20 y.o. female.  HPI   Patient is a 20 year old female who presents to the ER for abscess recheck and packing removal.  She reports mild continued drainage onto gauze dressings, improved pain, and decreased redness and swelling.  She has been taking her antibiotic as prescribed and has developed a yeast infection, request Diflucan to treat this, she has had this in the past.  She denies fevers, chills, sweats, nausea, vomiting.  Past Medical History:  Diagnosis Date  . Asthma     There are no active problems to display for this patient.   Past Surgical History:  Procedure Laterality Date  . LAPAROSCOPIC OVARIAN    . WISDOM TOOTH EXTRACTION      OB History    No data available       Home Medications    Prior to Admission medications   Medication Sig Start Date End Date Taking? Authorizing Provider  amoxicillin (AMOXIL) 500 MG capsule Take 2 capsules (1,000 mg total) by mouth 2 (two) times daily. Patient not taking: Reported on 07/12/2016 06/18/16   Everlene Farrier, PA-C  amoxicillin-clavulanate (AUGMENTIN) 875-125 MG tablet Take 1 tablet by mouth every 12 (twelve) hours. Patient not taking: Reported on 07/12/2016 02/29/16   Melton Krebs, PA-C  benzonatate (TESSALON) 100 MG capsule Take 1 capsule (100 mg total) by mouth every 8 (eight) hours. Patient not taking: Reported on 07/12/2016 06/16/15   Janne Napoleon, NP  cetirizine (ZYRTEC ALLERGY) 10 MG tablet Take 1 tablet (10 mg total) by mouth daily. Patient not taking: Reported on 07/12/2016 06/18/16   Everlene Farrier, PA-C  erythromycin ophthalmic ointment Place a 1/2 inch ribbon of ointment BID into the lower eyelid for 7 days Patient not taking: Reported on 07/12/2016 05/12/15   Marlon Pel, PA-C  fluconazole  (DIFLUCAN) 150 MG tablet Take 1 tablet (150 mg total) by mouth once. 07/14/16 07/14/16  Danelle Berry, PA-C  fluticasone (FLONASE) 50 MCG/ACT nasal spray Place 2 sprays into both nostrils daily. Patient not taking: Reported on 07/12/2016 06/18/16   Everlene Farrier, PA-C  ibuprofen (ADVIL,MOTRIN) 600 MG tablet Take 1 tablet (600 mg total) by mouth every 6 (six) hours as needed. Patient not taking: Reported on 07/12/2016 02/23/16   Tatyana Kirichenko, PA-C  ibuprofen (ADVIL,MOTRIN) 800 MG tablet Take 1 tablet (800 mg total) by mouth 3 (three) times daily. Patient not taking: Reported on 07/12/2016 10/28/15   Dahlia Client Muthersbaugh, PA-C  lidocaine (XYLOCAINE) 2 % solution Use as directed 20 mLs in the mouth or throat as needed for mouth pain. Patient not taking: Reported on 07/12/2016 08/05/15   Catha Gosselin, PA-C  naproxen (NAPROSYN) 500 MG tablet Take 1 tablet (500 mg total) by mouth 2 (two) times daily. 07/12/16   Antony Madura, PA-C  sulfamethoxazole-trimethoprim (BACTRIM DS,SEPTRA DS) 800-160 MG tablet Take 1 tablet by mouth 2 (two) times daily. 07/12/16 07/19/16  Antony Madura, PA-C  traMADol (ULTRAM) 50 MG tablet Take 1 tablet (50 mg total) by mouth every 6 (six) hours as needed for severe pain. 07/12/16   Antony Madura, PA-C    Family History History reviewed. No pertinent family history.  Social History Social History  Substance Use Topics  . Smoking status: Current Some Day Smoker    Types: Cigarettes  .  Smokeless tobacco: Never Used  . Alcohol use No     Allergies   Patient has no known allergies.   Review of Systems Review of Systems  All other systems reviewed and are negative.    Physical Exam Updated Vital Signs BP 124/56 (BP Location: Right Arm)   Pulse 74   Temp 98.5 F (36.9 C) (Oral)   Resp 16   Ht 5\' 8"  (1.727 m)   Wt 113.9 kg   LMP 06/21/2016   SpO2 100%   BMI 38.18 kg/m   Physical Exam  Constitutional: She is oriented to person, place, and time. She appears  well-developed and well-nourished. No distress.  HENT:  Head: Normocephalic and atraumatic.  Right Ear: External ear normal.  Left Ear: External ear normal.  Nose: Nose normal.  Mouth/Throat: Oropharynx is clear and moist.  Eyes: Conjunctivae are normal. Pupils are equal, round, and reactive to light. Right eye exhibits no discharge. Left eye exhibits no discharge. No scleral icterus.  Neck: Normal range of motion.  Cardiovascular: Normal rate and regular rhythm.   Pulmonary/Chest: Effort normal and breath sounds normal. No stridor. No respiratory distress.  Musculoskeletal: Normal range of motion. She exhibits no edema.  Neurological: She is alert and oriented to person, place, and time. She exhibits normal muscle tone. Coordination normal.  Skin: Skin is warm. No rash noted. She is not diaphoretic. No erythema. No pallor.     Psychiatric: She has a normal mood and affect. Her behavior is normal. Judgment and thought content normal.  Nursing note and vitals reviewed.    ED Treatments / Results  Labs (all labs ordered are listed, but only abnormal results are displayed) Labs Reviewed - No data to display  EKG  EKG Interpretation None       Radiology No results found.  Procedures Procedures (including critical care time)    Medications Ordered in ED Medications  fluconazole (DIFLUCAN) tablet 150 mg (150 mg Oral Given 07/14/16 2142)     Initial Impression / Assessment and Plan / ED Course  I have reviewed the triage vital signs and the nursing notes.  Pertinent labs & imaging results that were available during my care of the patient were reviewed by me and considered in my medical decision making (see chart for details).  Clinical Course     Pt with abscess seen two days ago with I&D. Chart reviewed in [facility]. Pt has been doing a very good job of taking care of the site at home and has been taking medications as directed. The iodoform packing ws mostly hanging out  and was removed, tolerated well.  The site is healthy appearing, with only a small amount of purulent drainage present on gauze, no drainage from incision, is open and without significant drainage. No surrounding cellulitis and no palpable fluctuance or induration indicating deeper infection or residual abscess. Pt is also without any concerning tenderness in the area, pain is significantly improved. Discussed home care and return precautions, pt advised to continue all antibiotics until they are gone. Complained of yeast infection, no improvement yet with monistat cream externally, requests flagyl, has needed two doses in the past with oral antibiotics   Final Clinical Impressions(s) / ED Diagnoses   Final diagnoses:  Wound check, abscess  Pilonidal abscess    New Prescriptions Discharge Medication List as of 07/14/2016  9:30 PM    START taking these medications   Details  fluconazole (DIFLUCAN) 150 MG tablet Take 1 tablet (150 mg total)  by mouth once., Starting Fri 07/14/2016, Print         Danelle BerryLeisa Kili Gracy, PA-C 07/14/16 2223    Gwyneth SproutWhitney Plunkett, MD 07/14/16 2232

## 2016-07-14 NOTE — ED Triage Notes (Signed)
Pt states seen here Tuesday for abscess. Pt states had packing placed. Returned today for removal of packing and general recheck. Pt states has continued to take abx as rx. Pt denies any fevers or chills.

## 2016-10-29 ENCOUNTER — Encounter (HOSPITAL_COMMUNITY): Payer: Self-pay | Admitting: Emergency Medicine

## 2016-10-29 ENCOUNTER — Ambulatory Visit (HOSPITAL_COMMUNITY)
Admission: EM | Admit: 2016-10-29 | Discharge: 2016-10-29 | Disposition: A | Discharge status: Home / Self Care | Payer: BLUE CROSS/BLUE SHIELD | Attending: Family Medicine | Admitting: Family Medicine

## 2016-10-29 DIAGNOSIS — J069 Acute upper respiratory infection, unspecified: Secondary | ICD-10-CM

## 2016-10-29 DIAGNOSIS — B9789 Other viral agents as the cause of diseases classified elsewhere: Secondary | ICD-10-CM

## 2016-10-29 NOTE — ED Provider Notes (Signed)
CSN: [id number]     Arrival date & time 10/29/16  1833 History   First MD Initiated Contact with Patient 10/29/16 1904     Chief Complaint  Patient presents with  . Cough   (Consider location/radiation/quality/duration/timing/severity/associated sxs/prior Treatment) 21 yo presents with cough, nasal congestion and fatigue. No known fevers. Symptoms started last night. No known exposures. Did not have flu vaccine.       Past Medical History:  Diagnosis Date  . Asthma    Past Surgical History:  Procedure Laterality Date  . LAPAROSCOPIC OVARIAN    . WISDOM TOOTH EXTRACTION     History reviewed. No pertinent family history. Social History  Substance Use Topics  . Smoking status: Current Some Day Smoker    Types: Cigarettes  . Smokeless tobacco: Never Used  . Alcohol use No   OB History    No data available     Review of Systems  Constitutional: Positive for fatigue. Negative for fever.  HENT: Positive for congestion and sinus pain.   Respiratory: Positive for cough. Negative for shortness of breath and wheezing.     Allergies  Patient has no known allergies.  Home Medications   Prior to Admission medications   Not on File   Meds Ordered and Administered this Visit  Medications - No data to display  BP 129/67 (BP Location: Right Arm)   Pulse 95   Temp 99.6 F (37.6 C) (Oral)   Resp 18   LMP 09/27/2016 (Exact Date)   SpO2 98%  No data found.   Physical Exam  Constitutional: She is oriented to person, place, and time. She appears well-developed and well-nourished. No distress.  HENT:  Head: Normocephalic and atraumatic.  Right Ear: External ear normal.  Left Ear: External ear normal.  Nose: Nose normal.  Mouth/Throat: Oropharynx is clear and moist.  Neck: Normal range of motion.  Pulmonary/Chest: Effort normal. She has no wheezes.  Lymphadenopathy:    She has no cervical adenopathy.  Neurological: She is alert and oriented to person, place, and time.   Skin: Skin is warm and dry. She is not diaphoretic.  Psychiatric: Her behavior is normal.  Nursing note and vitals reviewed.   Urgent Care Course     Procedures (including critical care time)  Labs Review Labs Reviewed - No data to display  Imaging Review No results found.   Visual Acuity Review  Right Eye Distance:   Left Eye Distance:   Bilateral Distance:    Right Eye Near:   Left Eye Near:    Bilateral Near:         MDM   1. Viral URI with cough    Viral illness. Treat with symptomatic care, fluids and rest. FU if worsens.     Riki SheerMichelle G Liliyana Thobe, PA-C 10/29/16 1919    Riki SheerMichelle G Nuchem Grattan, PA-C 10/29/16 Ernestina [id number]

## 2016-10-29 NOTE — Discharge Instructions (Signed)
You have a viral infection. Treat with rest and fluids. Ibuprofen 800 mg every 8 hours with Delsym as directed for cough can be beneficial as well. FU if you become worse.

## 2016-10-29 NOTE — ED Triage Notes (Signed)
The [facility] with a complaint of a cough, congestion and sinus pressure that started last night.

## 2016-12-07 ENCOUNTER — Encounter (HOSPITAL_COMMUNITY): Payer: Self-pay | Admitting: Family Medicine

## 2016-12-07 ENCOUNTER — Ambulatory Visit (HOSPITAL_COMMUNITY)
Admission: EM | Admit: 2016-12-07 | Discharge: 2016-12-07 | Disposition: A | Discharge status: Home / Self Care | Payer: BLUE CROSS/BLUE SHIELD | Attending: Family Medicine | Admitting: Family Medicine

## 2016-12-07 DIAGNOSIS — F17210 Nicotine dependence, cigarettes, uncomplicated: Secondary | ICD-10-CM | POA: Diagnosis not present

## 2016-12-07 DIAGNOSIS — N898 Other specified noninflammatory disorders of vagina: Secondary | ICD-10-CM | POA: Insufficient documentation

## 2016-12-07 LAB — POCT PREGNANCY, URINE: Preg Test, Ur: NEGATIVE

## 2016-12-07 MED ORDER — FLUCONAZOLE 150 MG PO TABS: 150.0000 mg | ORAL_TABLET | Freq: Once | ORAL | 0 refills | Status: AC

## 2016-12-07 NOTE — Discharge Instructions (Signed)
The tests that are being run will be completed in the next two days.

## 2016-12-07 NOTE — ED Triage Notes (Signed)
Pt  Reports  A   Thick  White  Discharge  For  Several  Days   Denies   Any  Sores or  Lesions     Ambulates  Upright  With a  Steady  Fluid  Gait        Had  Some  Pain   On   Sex      On last occurance

## 2016-12-07 NOTE — ED Provider Notes (Signed)
[facility]    CSN: [id number] Arrival date & time: 12/07/16  1242     History   Chief Complaint Chief Complaint  Patient presents with  . Vaginal Discharge    HPI Amanda Shields is a 21 y.o. female.   Is a 21 year old woman who is at the same partner for 5 years. Comes in with 3 days of vaginal discharge which she describes as white. She's had no pelvic pain.  Last menstrual period was exactly one month ago.  Patient's had a history of endometriosis and had a laparoscopy for this. Since that time she's had minimal pelvic pain. She's not taking anything for contraception now.      Past Medical History:  Diagnosis Date  . Asthma     There are no active problems to display for this patient.   Past Surgical History:  Procedure Laterality Date  . LAPAROSCOPIC OVARIAN    . WISDOM TOOTH EXTRACTION      OB History    No data available       Home Medications    Prior to Admission medications   Medication Sig Start Date End Date Taking? Authorizing Provider  fluconazole (DIFLUCAN) 150 MG tablet Take 1 tablet (150 mg total) by mouth once. Repeat if needed 12/07/16 12/07/16  Elvina Sidle, MD    Family History History reviewed. No pertinent family history.  Social History Social History  Substance Use Topics  . Smoking status: Current Some Day Smoker    Types: Cigarettes  . Smokeless tobacco: Never Used  . Alcohol use No     Allergies   Patient has no known allergies.   Review of Systems Review of Systems  Constitutional: Negative.   Gastrointestinal: Negative.   Genitourinary: Positive for vaginal discharge. Negative for dyspareunia, dysuria, vaginal bleeding and vaginal pain.     Physical Exam Triage Vital Signs ED Triage Vitals  Enc Vitals Group     BP      Pulse      Resp      Temp      Temp src      SpO2      Weight      Height      Head Circumference      Peak Flow      Pain Score      Pain Loc      Pain Edu?    Excl. in GC?    No data found.   Updated Vital Signs BP 120/70 (BP Location: Right Arm)   Pulse 72   Temp 98.6 F (37 C) (Oral)   Resp 18   LMP 11/11/2016   SpO2 100%    Physical Exam  Constitutional: She is oriented to person, place, and time. She appears well-developed and well-nourished.  HENT:  Right Ear: External ear normal.  Left Ear: External ear normal.  Mouth/Throat: Oropharynx is clear and moist.  Eyes: Conjunctivae are normal. Pupils are equal, round, and reactive to light.  Neck: Normal range of motion. Neck supple.  Pulmonary/Chest: Effort normal.  Genitourinary: Vaginal discharge found.  Genitourinary Comments: Creamy white vaginal discharge No cervical motion tenderness Normal external genitalia  Musculoskeletal: Normal range of motion.  Neurological: She is alert and oriented to person, place, and time.  Skin: Skin is warm and dry.  Nursing note and vitals reviewed.    UC Treatments / Results  Labs (all labs ordered are listed, but only abnormal results are displayed) Labs Reviewed  POCT PREGNANCY,  URINE  CERVICOVAGINAL ANCILLARY ONLY    EKG  EKG Interpretation None       Radiology No results found.  Procedures Procedures (including critical care time)  Medications Ordered in UC Medications - No data to display   Initial Impression / Assessment and Plan / UC Course  I have reviewed the triage vital signs and the nursing notes.  Pertinent labs & imaging results that were available during my care of the patient were reviewed by me and considered in my medical decision making (see chart for details).     Final Clinical Impressions(s) / UC Diagnoses   Final diagnoses:  Vaginal discharge    New Prescriptions New Prescriptions   FLUCONAZOLE (DIFLUCAN) 150 MG TABLET    Take 1 tablet (150 mg total) by mouth once. Repeat if needed     Elvina Sidle, MD 12/07/16 1319

## 2016-12-08 ENCOUNTER — Telehealth (HOSPITAL_COMMUNITY): Payer: Self-pay | Admitting: Internal Medicine

## 2016-12-08 LAB — CERVICOVAGINAL ANCILLARY ONLY
Chlamydia: POSITIVE — AB
Neisseria Gonorrhea: NEGATIVE
Wet Prep (BD Affirm): POSITIVE — AB

## 2016-12-08 MED ORDER — AZITHROMYCIN 500 MG PO TABS: 1000.0000 mg | ORAL_TABLET | Freq: Once | ORAL | 0 refills | Status: AC

## 2016-12-08 NOTE — Telephone Encounter (Signed)
Clinical staff, please let patient and health department know that test for chlamydia was positive.  Rx zithromax sent to pharmacy of record, [facility] on [address] at [employer].  Sexual partners need to be notified and tested/treated.  Condoms may reduce risk of reinfection.  Recheck for further evaluation if symptoms are not improving.  LM

## 2016-12-10 ENCOUNTER — Telehealth (HOSPITAL_COMMUNITY): Payer: Self-pay | Admitting: Internal Medicine

## 2016-12-10 MED ORDER — METRONIDAZOLE 500 MG PO TABS: 500.0000 mg | ORAL_TABLET | Freq: Two times a day (BID) | ORAL | 0 refills | Status: DC

## 2016-12-10 NOTE — Telephone Encounter (Signed)
Clinical staff, please let patient know of late test result for gardnerella (bacterial vaginosis), which was also positive.  Rx metronidazole sent to pharmacy of record, [facility] on [address].  Recheck for further evaluation if symptoms are not improving.  LM

## 2017-05-14 ENCOUNTER — Encounter (HOSPITAL_COMMUNITY): Payer: Self-pay | Admitting: Nurse Practitioner

## 2017-05-14 ENCOUNTER — Emergency Department (HOSPITAL_COMMUNITY)
Admission: EM | Admit: 2017-05-14 | Discharge: 2017-05-14 | Discharge status: Against Medical Advice | Payer: BLUE CROSS/BLUE SHIELD | Attending: Emergency Medicine | Admitting: Emergency Medicine

## 2017-05-14 DIAGNOSIS — R0981 Nasal congestion: Secondary | ICD-10-CM | POA: Insufficient documentation

## 2017-05-14 NOTE — ED Notes (Signed)
Patient called x3 with no answer. 

## 2017-05-14 NOTE — ED Triage Notes (Signed)
Pt presents with c/o congestion. The congestion began two days ago. She reports nasal and head congestion, sneezing, itching in her ears and throat. She denies pain. She has not tried anything at home for her symptoms.

## 2017-05-14 NOTE — ED Notes (Signed)
Pt called x3 no answer. Pt will be removed

## 2017-05-15 ENCOUNTER — Encounter (HOSPITAL_COMMUNITY): Payer: Self-pay | Admitting: *Deleted

## 2017-05-15 ENCOUNTER — Emergency Department (HOSPITAL_COMMUNITY): Payer: BLUE CROSS/BLUE SHIELD

## 2017-05-15 ENCOUNTER — Emergency Department (HOSPITAL_COMMUNITY)
Admission: EM | Admit: 2017-05-15 | Discharge: 2017-05-15 | Disposition: A | Discharge status: Home / Self Care | Payer: BLUE CROSS/BLUE SHIELD | Attending: Emergency Medicine | Admitting: Emergency Medicine

## 2017-05-15 DIAGNOSIS — R509 Fever, unspecified: Secondary | ICD-10-CM | POA: Insufficient documentation

## 2017-05-15 DIAGNOSIS — J40 Bronchitis, not specified as acute or chronic: Secondary | ICD-10-CM | POA: Diagnosis not present

## 2017-05-15 DIAGNOSIS — J029 Acute pharyngitis, unspecified: Secondary | ICD-10-CM | POA: Diagnosis not present

## 2017-05-15 DIAGNOSIS — J988 Other specified respiratory disorders: Secondary | ICD-10-CM

## 2017-05-15 DIAGNOSIS — Z87891 Personal history of nicotine dependence: Secondary | ICD-10-CM | POA: Insufficient documentation

## 2017-05-15 DIAGNOSIS — J45909 Unspecified asthma, uncomplicated: Secondary | ICD-10-CM | POA: Diagnosis not present

## 2017-05-15 DIAGNOSIS — Z79899 Other long term (current) drug therapy: Secondary | ICD-10-CM | POA: Diagnosis not present

## 2017-05-15 DIAGNOSIS — R0981 Nasal congestion: Secondary | ICD-10-CM | POA: Diagnosis present

## 2017-05-15 MED ORDER — LORATADINE-PSEUDOEPHEDRINE ER 5-120 MG PO TB12: 1.0000 | ORAL_TABLET | Freq: Two times a day (BID) | ORAL | 0 refills | Status: AC

## 2017-05-15 MED ORDER — AEROCHAMBER PLUS FLO-VU MEDIUM MISC
1.0000 | Freq: Once | Status: AC
  Administered 2017-05-15: 1
  Filled 2017-05-15: qty 1

## 2017-05-15 MED ORDER — BENZONATATE 100 MG PO CAPS: 100.0000 mg | ORAL_CAPSULE | Freq: Three times a day (TID) | ORAL | 0 refills | Status: DC | PRN

## 2017-05-15 MED ORDER — DEXAMETHASONE 4 MG PO TABS
10.0000 mg | ORAL_TABLET | Freq: Once | ORAL | Status: AC
  Administered 2017-05-15: 10 mg via ORAL
  Filled 2017-05-15: qty 3

## 2017-05-15 MED ORDER — ONDANSETRON 4 MG PO TBDP: 4.0000 mg | ORAL_TABLET | Freq: Three times a day (TID) | ORAL | 0 refills | Status: DC | PRN

## 2017-05-15 MED ORDER — AZITHROMYCIN 250 MG PO TABS: 250.0000 mg | ORAL_TABLET | Freq: Every day | ORAL | 0 refills | Status: DC

## 2017-05-15 MED ORDER — ALBUTEROL SULFATE HFA 108 (90 BASE) MCG/ACT IN AERS
2.0000 | INHALATION_SPRAY | Freq: Once | RESPIRATORY_TRACT | Status: AC
  Administered 2017-05-15: 2 via RESPIRATORY_TRACT
  Filled 2017-05-15: qty 6.7

## 2017-05-15 MED ORDER — FLUCONAZOLE 150 MG PO TABS: 150.0000 mg | ORAL_TABLET | Freq: Once | ORAL | 0 refills | Status: AC

## 2017-05-15 NOTE — ED Triage Notes (Signed)
Pt c/o congestion for the past two days. Pt coughing up clear phlegm, also reports headache, itching to R ear and throat

## 2017-05-15 NOTE — ED Provider Notes (Signed)
[facility] Provider Note   CSN: [id number] Arrival date & time: 05/15/17  0555     History   Chief Complaint Chief Complaint  Patient presents with  . Nasal Congestion    HPI Amanda Shields is a 21 y.o. female.  HPI   21 yo F with h/o asthma here with fevers, chills, body aches. Pt sx started 2 days ago as nasal congestion and sore throat, with itching around her nose and mouth. She then developed fevers, cough productive of white sputum. She has had associated mild SOB with this during coughing spells. No hemoptysis. She does have known sick contacts with similar sx, is a [employer] student. No nausea or vomiting. No h/o hospitalizations for asthma in the past. No diarrhea. No other complaints. She has not tried anything for her symptoms.  Past Medical History:  Diagnosis Date  . Asthma     There are no active problems to display for this patient.   Past Surgical History:  Procedure Laterality Date  . LAPAROSCOPIC OVARIAN    . WISDOM TOOTH EXTRACTION      OB History    No data available       Home Medications    Prior to Admission medications   Medication Sig Start Date End Date Taking? Authorizing Provider  metroNIDAZOLE (FLAGYL) 500 MG tablet Take 1 tablet (500 mg total) by mouth 2 (two) times daily. 12/10/16   Eustace MooreMurray, Laura W, MD    Family History No family history on file.  Social History Social History  Substance Use Topics  . Smoking status: Former Smoker    Types: Cigarettes  . Smokeless tobacco: Never Used  . Alcohol use No     Allergies   Patient has no known allergies.   Review of Systems Review of Systems  Constitutional: Positive for chills and fatigue. Negative for fever.  HENT: Positive for congestion, rhinorrhea and sore throat.   Eyes: Negative for visual disturbance.  Respiratory: Positive for cough. Negative for shortness of breath and wheezing.   Cardiovascular: Negative for chest pain and leg swelling.    Gastrointestinal: Negative for abdominal pain, diarrhea, nausea and vomiting.  Genitourinary: Negative for dysuria and flank pain.  Musculoskeletal: Negative for neck pain and neck stiffness.  Skin: Negative for rash and wound.  Allergic/Immunologic: Negative for immunocompromised state.  Neurological: Negative for syncope, weakness and headaches.  All other systems reviewed and are negative.    Physical Exam Updated Vital Signs BP (!) 133/97   Pulse 89   Temp 98.4 F (36.9 C)   Resp 18   LMP 05/13/2017   SpO2 99%   Physical Exam  Constitutional: She is oriented to person, place, and time. She appears well-developed and well-nourished. No distress.  HENT:  Head: Normocephalic and atraumatic.  Moderate nasal congestion, mild posterior pharyngeal erythema. No tonsillar exudates or swelling. TMs normal b/l.  Eyes: Conjunctivae are normal.  Neck: Neck supple.  Cardiovascular: Normal rate, regular rhythm and normal heart sounds.  Exam reveals no friction rub.   No murmur heard. Pulmonary/Chest: Effort normal. No respiratory distress. She has wheezes (Faint, mild, end-expiratory). She has no rales.  Abdominal: She exhibits no distension.  Musculoskeletal: She exhibits no edema.  Neurological: She is alert and oriented to person, place, and time. She exhibits normal muscle tone.  Skin: Skin is warm. Capillary refill takes less than 2 seconds.  Psychiatric: She has a normal mood and affect.  Nursing note and vitals reviewed.    ED Treatments /  Results  Labs (all labs ordered are listed, but only abnormal results are displayed) Labs Reviewed - No data to display  EKG  EKG Interpretation None       Radiology Dg Chest 2 View  Result Date: 05/15/2017 CLINICAL DATA:  Cough, shortness of breath, and vomiting. History of childhood asthma. Former smoker. EXAM: CHEST  2 VIEW COMPARISON:  None in PACs FINDINGS: The lungs are well-expanded and clear. The heart and pulmonary  vascularity are normal. The mediastinum is normal in width. There is no pleural effusion. The bony thorax exhibits no acute abnormality. IMPRESSION: There is no active cardiopulmonary disease. Electronically Signed   By: David  [place] M.D.   On: 05/15/2017 08:37    Procedures Procedures (including critical care time)  Medications Ordered in ED Medications  AEROCHAMBER PLUS FLO-VU MEDIUM MISC 1 each (not administered)  dexamethasone (DECADRON) tablet 10 mg (10 mg Oral Given 05/15/17 0837)  albuterol (PROVENTIL HFA;VENTOLIN HFA) 108 (90 Base) MCG/ACT inhaler 2 puff (2 puffs Inhalation Given 05/15/17 1610)     Initial Impression / Assessment and Plan / ED Course  I have reviewed the triage vital signs and the nursing notes.  Pertinent labs & imaging results that were available during my care of the patient were reviewed by me and considered in my medical decision making (see chart for details).     21 yo F with PMHx as above here with nasal congestion, sore throat, and cough. Exam shows mild wheezes, c/w mild asthma/RAD component of likely viral URI. CXR shows no focal PNA. Pt o/w well appearing, afebrile, and in NAD. Will give decadron, albuterol, and tx for mild bronchitis given sputum production.   Final Clinical Impressions(s) / ED Diagnoses   Final diagnoses:  Bronchitis  Wheezing-associated respiratory infection (WARI)    New Prescriptions New Prescriptions   No medications on file     Shaune Pollack, MD 05/15/17 [phone]

## 2017-06-16 ENCOUNTER — Encounter (HOSPITAL_COMMUNITY): Payer: Self-pay | Admitting: Emergency Medicine

## 2017-06-16 ENCOUNTER — Emergency Department (HOSPITAL_COMMUNITY)
Admission: EM | Admit: 2017-06-16 | Discharge: 2017-06-16 | Disposition: A | Discharge status: Home / Self Care | Payer: BLUE CROSS/BLUE SHIELD | Attending: Emergency Medicine | Admitting: Emergency Medicine

## 2017-06-16 DIAGNOSIS — Z5321 Procedure and treatment not carried out due to patient leaving prior to being seen by health care provider: Secondary | ICD-10-CM | POA: Diagnosis not present

## 2017-06-16 DIAGNOSIS — R229 Localized swelling, mass and lump, unspecified: Secondary | ICD-10-CM | POA: Diagnosis present

## 2017-06-16 NOTE — ED Triage Notes (Signed)
Pt c/o bump on her buttocks x 1 week.  States she was seen by Dr while on fall break and was told it was a pilonidal cyst and to be rechecked if it got worse.

## 2017-06-16 NOTE — ED Notes (Signed)
Pt informed RN that she could not wait anymore and was going home.

## 2017-06-21 ENCOUNTER — Encounter (HOSPITAL_COMMUNITY): Payer: Self-pay | Admitting: Emergency Medicine

## 2017-06-21 ENCOUNTER — Emergency Department (HOSPITAL_COMMUNITY)
Admission: EM | Admit: 2017-06-21 | Discharge: 2017-06-21 | Disposition: A | Discharge status: Home / Self Care | Payer: BLUE CROSS/BLUE SHIELD | Attending: Emergency Medicine | Admitting: Emergency Medicine

## 2017-06-21 DIAGNOSIS — Z5321 Procedure and treatment not carried out due to patient leaving prior to being seen by health care provider: Secondary | ICD-10-CM | POA: Diagnosis not present

## 2017-06-21 DIAGNOSIS — L0231 Cutaneous abscess of buttock: Secondary | ICD-10-CM | POA: Diagnosis present

## 2017-06-21 HISTORY — DX: Polycystic ovarian syndrome: E28.2

## 2017-06-21 HISTORY — DX: Endometriosis, unspecified: N80.9

## 2017-06-21 HISTORY — DX: Cutaneous abscess, unspecified: L02.91

## 2017-06-21 NOTE — ED Notes (Signed)
Pt approached nurse first stating she just spoke with her surgeon Dr. Cherly Hensenhang and is leaving the emergency dept to be seen in his office now. Pt wrist band removed and pt ambulatory out of department.

## 2017-06-21 NOTE — ED Triage Notes (Signed)
Pt reports abscess to left buttocks that opened today. Had area lanced Oct. 13th. Now area swollen painful again. VSS.

## 2017-07-30 ENCOUNTER — Ambulatory Visit (HOSPITAL_COMMUNITY)
Admission: EM | Admit: 2017-07-30 | Discharge: 2017-07-30 | Disposition: A | Discharge status: Home / Self Care | Payer: BLUE CROSS/BLUE SHIELD | Attending: Internal Medicine | Admitting: Internal Medicine

## 2017-07-30 ENCOUNTER — Encounter (HOSPITAL_COMMUNITY): Payer: Self-pay | Admitting: Emergency Medicine

## 2017-07-30 DIAGNOSIS — J45909 Unspecified asthma, uncomplicated: Secondary | ICD-10-CM | POA: Insufficient documentation

## 2017-07-30 DIAGNOSIS — R102 Pelvic and perineal pain: Secondary | ICD-10-CM

## 2017-07-30 DIAGNOSIS — B373 Candidiasis of vulva and vagina: Secondary | ICD-10-CM | POA: Diagnosis not present

## 2017-07-30 DIAGNOSIS — Z87891 Personal history of nicotine dependence: Secondary | ICD-10-CM | POA: Diagnosis not present

## 2017-07-30 DIAGNOSIS — N898 Other specified noninflammatory disorders of vagina: Secondary | ICD-10-CM

## 2017-07-30 DIAGNOSIS — N760 Acute vaginitis: Secondary | ICD-10-CM

## 2017-07-30 DIAGNOSIS — N926 Irregular menstruation, unspecified: Secondary | ICD-10-CM | POA: Insufficient documentation

## 2017-07-30 DIAGNOSIS — E282 Polycystic ovarian syndrome: Secondary | ICD-10-CM | POA: Diagnosis not present

## 2017-07-30 DIAGNOSIS — Z79899 Other long term (current) drug therapy: Secondary | ICD-10-CM | POA: Diagnosis not present

## 2017-07-30 DIAGNOSIS — Z3202 Encounter for pregnancy test, result negative: Secondary | ICD-10-CM

## 2017-07-30 DIAGNOSIS — B3731 Acute candidiasis of vulva and vagina: Secondary | ICD-10-CM

## 2017-07-30 DIAGNOSIS — B9689 Other specified bacterial agents as the cause of diseases classified elsewhere: Secondary | ICD-10-CM | POA: Insufficient documentation

## 2017-07-30 LAB — POCT URINALYSIS DIP (DEVICE)
GLUCOSE, UA: NEGATIVE mg/dL
HGB URINE DIPSTICK: NEGATIVE
KETONES UR: NEGATIVE mg/dL
Nitrite: NEGATIVE
PROTEIN: NEGATIVE mg/dL
SPECIFIC GRAVITY, URINE: 1.025 (ref 1.005–1.030)
UROBILINOGEN UA: 0.2 mg/dL (ref 0.0–1.0)
pH: 6.5 (ref 5.0–8.0)

## 2017-07-30 LAB — POCT PREGNANCY, URINE: Preg Test, Ur: NEGATIVE

## 2017-07-30 MED ORDER — FLUCONAZOLE 150 MG PO TABS: ORAL_TABLET | ORAL | 0 refills | Status: DC

## 2017-07-30 MED ORDER — METRONIDAZOLE 500 MG PO TABS
500.0000 mg | ORAL_TABLET | Freq: Two times a day (BID) | ORAL | 0 refills | Status: DC
Start: 2017-07-30 — End: 2017-10-31

## 2017-07-30 NOTE — ED Provider Notes (Signed)
[facility]    CSN: [id number] Arrival date & time: 07/30/17  1248     History   Chief Complaint Chief Complaint  Patient presents with  . Vaginal Discharge    HPI Amanda Shields is a 21 y.o. female.   21 year old female presented to the urgent care complaining of vaginal discharge with a "different smell". This started about one week ago. She also has occasional pelvic pain. Denies fever, chills, abdominal pain or GI symptoms. History of irregular periods and PCO S. LMP November 9 of the 12th. Also had some irregular spotting on the 19th and 20th of this month.      Past Medical History:  Diagnosis Date  . Abscess   . Asthma   . Endometriosis   . PCOS (polycystic ovarian syndrome)     There are no active problems to display for this patient.   Past Surgical History:  Procedure Laterality Date  . LAPAROSCOPIC OVARIAN    . WISDOM TOOTH EXTRACTION      OB History    No data available       Home Medications    Prior to Admission medications   Medication Sig Start Date End Date Taking? Authorizing Provider  azithromycin (ZITHROMAX) 250 MG tablet Take 1 tablet (250 mg total) by mouth daily. Take first 2 tablets together, then 1 every day until finished. 05/15/17   Shaune PollackIsaacs, Cameron, MD  benzonatate (TESSALON) 100 MG capsule Take 1 capsule (100 mg total) by mouth 3 (three) times daily as needed for cough. 05/15/17   Shaune PollackIsaacs, Cameron, MD  fluconazole (DIFLUCAN) 150 MG tablet 1 tab po x 1. May repeat in 72 hours if no improvement 07/30/17   Hayden RasmussenMabe, Connor Foxworthy, NP  metroNIDAZOLE (FLAGYL) 500 MG tablet Take 1 tablet (500 mg total) by mouth 2 (two) times daily. X 7 days 07/30/17   Hayden RasmussenMabe, Reilynn Lauro, NP  ondansetron (ZOFRAN ODT) 4 MG disintegrating tablet Take 1 tablet (4 mg total) by mouth every 8 (eight) hours as needed for nausea or vomiting. 05/15/17   Shaune PollackIsaacs, Cameron, MD    Family History History reviewed. No pertinent family history.  Social History Social History     Tobacco Use  . Smoking status: Former Smoker    Types: Cigarettes  . Smokeless tobacco: Never Used  Substance Use Topics  . Alcohol use: No  . Drug use: No     Allergies   Patient has no known allergies.   Review of Systems Review of Systems  Constitutional: Negative.   Gastrointestinal: Negative.   Genitourinary: Positive for pelvic pain and vaginal discharge. Negative for dysuria and frequency.  Musculoskeletal: Negative.   All other systems reviewed and are negative.    Physical Exam Triage Vital Signs ED Triage Vitals [07/30/17 1303]  Enc Vitals Group     BP (!) 177/95     Pulse Rate (!) 102     Resp 18     Temp 98.3 F (36.8 C)     Temp Source Oral     SpO2 97 %     Weight      Height      Head Circumference      Peak Flow      Pain Score      Pain Loc      Pain Edu?      Excl. in GC?    No data found.  Updated Vital Signs BP (!) 177/95 (BP Location: Right Arm)   Pulse (!) 102  Temp 98.3 F (36.8 C) (Oral)   Resp 18   SpO2 97%   Visual Acuity Right Eye Distance:   Left Eye Distance:   Bilateral Distance:    Right Eye Near:   Left Eye Near:    Bilateral Near:     Physical Exam  Constitutional: She is oriented to person, place, and time. She appears well-developed and well-nourished. No distress.  Neck: Neck supple.  Cardiovascular: Normal rate.  Pulmonary/Chest: Effort normal. No respiratory distress.  Abdominal: Soft. She exhibits no distension. There is no tenderness.  Genitourinary:  Genitourinary Comments: Augustin SchoolingKim Hutchens, RN present. Normal external female genitalia There is a small amount of thin gray discharge deep within the vaginal vault. Vaginal walls without lesions. Cervix is smooth and pink. No lesions. No CMT or adnexal tenderness.  Musculoskeletal: Normal range of motion. She exhibits no edema.  Neurological: She is alert and oriented to person, place, and time. No cranial nerve deficit. She exhibits normal muscle tone.   Skin: Skin is warm and dry.  Psychiatric: She has a normal mood and affect.  Nursing note and vitals reviewed.    UC Treatments / Results  Labs (all labs ordered are listed, but only abnormal results are displayed) Labs Reviewed  POCT URINALYSIS DIP (DEVICE) - Abnormal; Notable for the following components:      Result Value   Bilirubin Urine SMALL (*)    Leukocytes, UA SMALL (*)    All other components within normal limits  POCT PREGNANCY, URINE  CERVICOVAGINAL ANCILLARY ONLY    EKG  EKG Interpretation None       Radiology No results found.  Procedures Procedures (including critical care time)  Medications Ordered in UC Medications - No data to display   Initial Impression / Assessment and Plan / UC Course  I have reviewed the triage vital signs and the nursing notes.  Pertinent labs & imaging results that were available during my care of the patient were reviewed by me and considered in my medical decision making (see chart for details).       Final Clinical Impressions(s) / UC Diagnoses   Final diagnoses:  BV (bacterial vaginosis)  Vaginal yeast infection    ED Discharge Orders        Ordered    metroNIDAZOLE (FLAGYL) 500 MG tablet  2 times daily     07/30/17 1327    fluconazole (DIFLUCAN) 150 MG tablet     07/30/17 1327       Controlled Substance Prescriptions [place] Controlled Substance Registry consulted? Not Applicable   Hayden RasmussenMabe, Scout Gumbs, NP 07/30/17 1329

## 2017-07-30 NOTE — ED Triage Notes (Signed)
Pt sts abnormal vaginal discharge with some blood noted and odor

## 2017-07-31 LAB — CERVICOVAGINAL ANCILLARY ONLY
BACTERIAL VAGINITIS: NEGATIVE
CANDIDA VAGINITIS: NEGATIVE
CHLAMYDIA, DNA PROBE: NEGATIVE
NEISSERIA GONORRHEA: NEGATIVE
TRICH (WINDOWPATH): POSITIVE — AB

## 2017-10-06 ENCOUNTER — Emergency Department (HOSPITAL_COMMUNITY)
Admission: EM | Admit: 2017-10-06 | Discharge: 2017-10-06 | Disposition: A | Discharge status: Home / Self Care | Payer: BLUE CROSS/BLUE SHIELD | Attending: Emergency Medicine | Admitting: Emergency Medicine

## 2017-10-06 ENCOUNTER — Encounter (HOSPITAL_COMMUNITY): Payer: Self-pay | Admitting: *Deleted

## 2017-10-06 DIAGNOSIS — J45909 Unspecified asthma, uncomplicated: Secondary | ICD-10-CM | POA: Diagnosis not present

## 2017-10-06 DIAGNOSIS — Z87891 Personal history of nicotine dependence: Secondary | ICD-10-CM | POA: Diagnosis not present

## 2017-10-06 DIAGNOSIS — N898 Other specified noninflammatory disorders of vagina: Secondary | ICD-10-CM | POA: Insufficient documentation

## 2017-10-06 LAB — WET PREP, GENITAL
Clue Cells Wet Prep HPF POC: NONE SEEN
Sperm: NONE SEEN
Trich, Wet Prep: NONE SEEN
Yeast Wet Prep HPF POC: NONE SEEN

## 2017-10-06 MED ORDER — AZITHROMYCIN 250 MG PO TABS
1000.0000 mg | ORAL_TABLET | Freq: Once | ORAL | Status: AC
  Administered 2017-10-06: 1000 mg via ORAL
  Filled 2017-10-06: qty 4

## 2017-10-06 MED ORDER — CEFTRIAXONE SODIUM 250 MG IJ SOLR
250.0000 mg | Freq: Once | INTRAMUSCULAR | Status: AC
  Administered 2017-10-06: 250 mg via INTRAMUSCULAR
  Filled 2017-10-06: qty 250

## 2017-10-06 MED ORDER — LIDOCAINE HCL (PF) 1 % IJ SOLN
INTRAMUSCULAR | Status: AC
  Administered 2017-10-06: 15:00:00
  Filled 2017-10-06: qty 5

## 2017-10-06 NOTE — ED Notes (Signed)
Declined W/C at D/C and was escorted to lobby by RN. 

## 2017-10-06 NOTE — ED Provider Notes (Signed)
[facility] Provider Note   CSN: [id number] Arrival date & time: 10/06/17  1347     History   Chief Complaint Chief Complaint  Patient presents with  . Vaginal Discharge    HPI Amanda Shields is a 22 y.o. female.  The history is provided by the patient and medical records. No language interpreter was used.  Vaginal Discharge     Amanda Shields is a 22 y.o. female who presents to the Emergency Department complaining of thick white vaginal discharge x 1 week. She had unprotected intercourse 2 weeks ago. She also started using a new soap last week.  No medications taken prior to arrival for symptoms.  No alleviating or aggravating factors noted.  Denies abdominal pain, nausea, vomiting, back pain, dysuria, urinary urgency / frequency, vaginal itching. No other symptoms, just discharge. LMP 1/14.   Past Medical History:  Diagnosis Date  . Abscess   . Asthma   . Endometriosis   . PCOS (polycystic ovarian syndrome)     There are no active problems to display for this patient.   Past Surgical History:  Procedure Laterality Date  . LAPAROSCOPIC OVARIAN    . WISDOM TOOTH EXTRACTION      OB History    No data available       Home Medications    Prior to Admission medications   Medication Sig Start Date End Date Taking? Authorizing Provider  azithromycin (ZITHROMAX) 250 MG tablet Take 1 tablet (250 mg total) by mouth daily. Take first 2 tablets together, then 1 every day until finished. 05/15/17   Shaune PollackIsaacs, Cameron, MD  benzonatate (TESSALON) 100 MG capsule Take 1 capsule (100 mg total) by mouth 3 (three) times daily as needed for cough. 05/15/17   Shaune PollackIsaacs, Cameron, MD  fluconazole (DIFLUCAN) 150 MG tablet 1 tab po x 1. May repeat in 72 hours if no improvement 07/30/17   Hayden RasmussenMabe, David, NP  metroNIDAZOLE (FLAGYL) 500 MG tablet Take 1 tablet (500 mg total) by mouth 2 (two) times daily. X 7 days 07/30/17   Hayden RasmussenMabe, David, NP  ondansetron (ZOFRAN  ODT) 4 MG disintegrating tablet Take 1 tablet (4 mg total) by mouth every 8 (eight) hours as needed for nausea or vomiting. 05/15/17   Shaune PollackIsaacs, Cameron, MD    Family History No family history on file.  Social History Social History   Tobacco Use  . Smoking status: Former Smoker    Types: Cigarettes  . Smokeless tobacco: Never Used  Substance Use Topics  . Alcohol use: No  . Drug use: No     Allergies   Patient has no known allergies.   Review of Systems Review of Systems  Genitourinary: Positive for vaginal discharge.  All other systems reviewed and are negative.    Physical Exam Updated Vital Signs BP (!) 148/92   Pulse 87   Temp 98.8 F (37.1 C) (Oral)   Resp 16   LMP 09/17/2017 (Exact Date)   SpO2 100%   Physical Exam  Constitutional: She is oriented to person, place, and time. She appears well-developed and well-nourished. No distress.  HENT:  Head: Normocephalic and atraumatic.  Cardiovascular: Normal rate, regular rhythm and normal heart sounds.  No murmur heard. Pulmonary/Chest: Effort normal and breath sounds normal. No respiratory distress.  Abdominal: Soft. She exhibits no distension.  No abdominal or CVA tenderness.   Genitourinary:  Genitourinary Comments: Chaperone present for exam. + white discharge. No CMT. No adnexal masses, tenderness, or fullness.  No bleeding within vaginal vault.  Neurological: She is alert and oriented to person, place, and time.  Skin: Skin is warm and dry.  Nursing note and vitals reviewed.    ED Treatments / Results  Labs (all labs ordered are listed, but only abnormal results are displayed) Labs Reviewed  WET PREP, GENITAL - Abnormal; Notable for the following components:      Result Value   WBC, Wet Prep HPF POC MANY (*)    All other components within normal limits  GC/CHLAMYDIA PROBE AMP ([place]) NOT AT [facility]    EKG  EKG Interpretation None       Radiology No results  found.  Procedures Procedures (including critical care time)  Medications Ordered in ED Medications  cefTRIAXone (ROCEPHIN) injection 250 mg (not administered)  azithromycin (ZITHROMAX) tablet 1,000 mg (not administered)     Initial Impression / Assessment and Plan / ED Course  I have reviewed the triage vital signs and the nursing notes.  Pertinent labs & imaging results that were available during my care of the patient were reviewed by me and considered in my medical decision making (see chart for details).    Amanda Shields is a 22 y.o. female who presents to ED for vaginal discharge x 1 week. No associated symptoms. No urinary symptoms. Did have unprotected intercourse 2 weeks ago. On exam, patient is afebrile, hemodynamically stable with no abdominal or CVA tenderness.  GU exam with white discharge, but no adnexal or cervical motion tenderness.  Wet prep shows many WBC's but otherwise unremarkable. Prophylactically treated with azithro and rocephin. GYN or PCP follow up if symptoms persist. Return precautions discussed and all questions answered.   Final Clinical Impressions(s) / ED Diagnoses   Final diagnoses:  Vaginal discharge    ED Discharge Orders    None       Ward, Chase Picket, PA-C 10/06/17 1449    Eber Hong, MD 10/07/17 [phone]

## 2017-10-06 NOTE — ED Triage Notes (Signed)
To ED for eval of thick white vaginal discharge for the past week. No itching. No pain. States she started a new soap.

## 2017-10-06 NOTE — Discharge Instructions (Addendum)
It was my pleasure taking care of you today!  Use a condom with every sexual encounter Follow up with your doctor or OBGYN if symptoms do not improve. If you do not have a doctor, you can call the women's clinic listed.  Please return to the ER for worsening symptoms, high fevers or persistent vomiting.  You have been tested for chlamydia and gonorrhea. These results will be available in approximately 3 days. You will be notified if they are positive.

## 2017-10-08 LAB — GC/CHLAMYDIA PROBE AMP (~~LOC~~) NOT AT ARMC
CHLAMYDIA, DNA PROBE: NEGATIVE
NEISSERIA GONORRHEA: NEGATIVE

## 2017-10-31 ENCOUNTER — Encounter (HOSPITAL_COMMUNITY): Payer: Self-pay | Admitting: Emergency Medicine

## 2017-10-31 ENCOUNTER — Other Ambulatory Visit: Payer: Self-pay

## 2017-10-31 ENCOUNTER — Ambulatory Visit (HOSPITAL_COMMUNITY)
Admission: EM | Admit: 2017-10-31 | Discharge: 2017-10-31 | Disposition: A | Discharge status: Home / Self Care | Payer: BLUE CROSS/BLUE SHIELD | Attending: Urgent Care | Admitting: Urgent Care

## 2017-10-31 DIAGNOSIS — N809 Endometriosis, unspecified: Secondary | ICD-10-CM | POA: Insufficient documentation

## 2017-10-31 DIAGNOSIS — E282 Polycystic ovarian syndrome: Secondary | ICD-10-CM | POA: Insufficient documentation

## 2017-10-31 DIAGNOSIS — N898 Other specified noninflammatory disorders of vagina: Secondary | ICD-10-CM | POA: Insufficient documentation

## 2017-10-31 DIAGNOSIS — J45909 Unspecified asthma, uncomplicated: Secondary | ICD-10-CM | POA: Insufficient documentation

## 2017-10-31 DIAGNOSIS — Z7251 High risk heterosexual behavior: Secondary | ICD-10-CM

## 2017-10-31 DIAGNOSIS — N760 Acute vaginitis: Secondary | ICD-10-CM | POA: Insufficient documentation

## 2017-10-31 DIAGNOSIS — B9689 Other specified bacterial agents as the cause of diseases classified elsewhere: Secondary | ICD-10-CM | POA: Diagnosis not present

## 2017-10-31 LAB — POCT PREGNANCY, URINE: PREG TEST UR: NEGATIVE

## 2017-10-31 MED ORDER — LIDOCAINE HCL (PF) 1 % IJ SOLN
INTRAMUSCULAR | Status: AC
  Filled 2017-10-31: qty 2

## 2017-10-31 MED ORDER — METRONIDAZOLE 500 MG PO TABS: 500.0000 mg | ORAL_TABLET | Freq: Two times a day (BID) | ORAL | 0 refills | Status: DC

## 2017-10-31 MED ORDER — CEFTRIAXONE SODIUM 250 MG IJ SOLR
INTRAMUSCULAR | Status: AC
  Filled 2017-10-31: qty 250

## 2017-10-31 MED ORDER — AZITHROMYCIN 250 MG PO TABS
ORAL_TABLET | ORAL | Status: AC
  Filled 2017-10-31: qty 4

## 2017-10-31 MED ORDER — CEFTRIAXONE SODIUM 250 MG IJ SOLR
250.0000 mg | Freq: Once | INTRAMUSCULAR | Status: AC
  Administered 2017-10-31: 250 mg via INTRAMUSCULAR

## 2017-10-31 MED ORDER — AZITHROMYCIN 250 MG PO TABS
1000.0000 mg | ORAL_TABLET | Freq: Once | ORAL | Status: AC
  Administered 2017-10-31: 1000 mg via ORAL

## 2017-10-31 MED ORDER — FLUCONAZOLE 150 MG PO TABS: 150.0000 mg | ORAL_TABLET | ORAL | 0 refills | Status: DC

## 2017-10-31 NOTE — ED Provider Notes (Signed)
  MRN: [id number] DOB: 08/02/1996  Subjective:   Amanda Shields is a 22 y.o. female presenting for 3 day history of excessive malodorous vaginal discharge. Patient had unprotected sex with her boyfriend 5 days ago. She believes they are monogamous and her symptoms feel like BV she has had in the past. Denies fever, n/v, abdominal pain, pelvic pain, dysuria, hematuria, genital rash, vaginal itching. She prefers to get covered for STI, would like script for diflucan due to antibiotic associated yeast infections.  Amanda Shields is not currently taking any medications and has No Known Allergies.  Amanda Shields  has a past medical history of Abscess, Asthma, Endometriosis, and PCOS (polycystic ovarian syndrome). Also  has a past surgical history that includes Wisdom tooth extraction and Laparoscopic ovarian.  Objective:   Vitals: BP (!) 146/84   Pulse 100   Temp 98.2 F (36.8 C)   Resp 18   LMP 10/18/2017   SpO2 100%   Physical Exam  Constitutional: She is oriented to person, place, and time. She appears well-developed and well-nourished.  Cardiovascular: Normal rate.  Pulmonary/Chest: Effort normal.  Neurological: She is alert and oriented to person, place, and time.   Results for orders placed or performed during the hospital encounter of 10/31/17 (from the past 24 hour(s))  Pregnancy, urine POC     Status: None   Collection Time: 10/31/17  7:29 PM  Result Value Ref Range   Preg Test, Ur NEGATIVE NEGATIVE   Assessment and Plan :   Vaginal discharge  Bacterial vaginosis  Unprotected sex  Will cover empirically for gonorrhea, chlamydia. Patient is requesting antibiotic for BV and yeast infection as well. Counseled patient on potential for adverse effects with medications prescribed today, patient verbalized understanding. Return-to-clinic precautions discussed, patient verbalized understanding.    Wallis BambergMani, Krystie Leiter, [place]-C 10/31/17 1932

## 2017-10-31 NOTE — ED Triage Notes (Signed)
Pt states she had intercourse with her boyfriend Saturday and Sunday "it didn't smell right" Pt endorses discharge yesterday and today, abnormal amount of discharge. Hx of yeast and BV.

## 2017-11-01 LAB — URINE CYTOLOGY ANCILLARY ONLY
CHLAMYDIA, DNA PROBE: NEGATIVE
Neisseria Gonorrhea: NEGATIVE
Trichomonas: NEGATIVE

## 2017-11-02 LAB — URINE CYTOLOGY ANCILLARY ONLY: CANDIDA VAGINITIS: NEGATIVE

## 2017-12-14 ENCOUNTER — Other Ambulatory Visit: Payer: Self-pay

## 2017-12-14 ENCOUNTER — Emergency Department (HOSPITAL_COMMUNITY)
Admission: EM | Admit: 2017-12-14 | Discharge: 2017-12-14 | Disposition: A | Discharge status: Home / Self Care | Payer: BLUE CROSS/BLUE SHIELD | Attending: Emergency Medicine | Admitting: Emergency Medicine

## 2017-12-14 ENCOUNTER — Emergency Department (HOSPITAL_COMMUNITY): Payer: BLUE CROSS/BLUE SHIELD

## 2017-12-14 ENCOUNTER — Encounter (HOSPITAL_COMMUNITY): Payer: Self-pay | Admitting: *Deleted

## 2017-12-14 DIAGNOSIS — J069 Acute upper respiratory infection, unspecified: Secondary | ICD-10-CM | POA: Diagnosis not present

## 2017-12-14 DIAGNOSIS — N760 Acute vaginitis: Secondary | ICD-10-CM | POA: Diagnosis not present

## 2017-12-14 DIAGNOSIS — J45909 Unspecified asthma, uncomplicated: Secondary | ICD-10-CM | POA: Diagnosis not present

## 2017-12-14 DIAGNOSIS — Z87891 Personal history of nicotine dependence: Secondary | ICD-10-CM | POA: Insufficient documentation

## 2017-12-14 DIAGNOSIS — B9689 Other specified bacterial agents as the cause of diseases classified elsewhere: Secondary | ICD-10-CM | POA: Insufficient documentation

## 2017-12-14 DIAGNOSIS — R05 Cough: Secondary | ICD-10-CM | POA: Diagnosis present

## 2017-12-14 LAB — WET PREP, GENITAL
SPERM: NONE SEEN
Trich, Wet Prep: NONE SEEN
YEAST WET PREP: NONE SEEN

## 2017-12-14 MED ORDER — METRONIDAZOLE 500 MG PO TABS: 500.0000 mg | ORAL_TABLET | Freq: Two times a day (BID) | ORAL | 0 refills | Status: DC

## 2017-12-14 MED ORDER — FLUCONAZOLE 150 MG PO TABS: 150.0000 mg | ORAL_TABLET | Freq: Every day | ORAL | 0 refills | Status: AC

## 2017-12-14 NOTE — ED Triage Notes (Signed)
Pt in c/o cough and congestion for the last few days, thought it was allergies but symptoms continue to get worse, no distress noted

## 2017-12-14 NOTE — ED Provider Notes (Signed)
[facility] Provider [facility]   CSN: [id number] Arrival date & time: 12/14/17  0947     History   Chief Complaint Chief Complaint  Patient presents with  . URI    HPI Amanda Shields is a 22 y.o. female with past medical history of asthma, presenting to the ED with 3 days of nasal congestion and cough.  Patient states cough is productive of clear sputum.  Has been taking OTC allergy medications without much relief.  States she does not feel short of breath or wheezing, and therefore has not needed to use her albuterol inhaler.  Denies fever or chills, sore throat, ear pain, or other associated symptoms.  Patient mentioning second complaint, complaining of a "bump" inside her vagina that she would also like evaluated during this visit. States area is not painful. She is currently sexually active with female partners and uses protection. Denies vaginal bleeding or discharge.  The history is provided by the patient.    Past Medical History:  Diagnosis Date  . Abscess   . Asthma   . Endometriosis   . PCOS (polycystic ovarian syndrome)     There are no active problems to display for this patient.   Past Surgical History:  Procedure Laterality Date  . LAPAROSCOPIC OVARIAN    . WISDOM TOOTH EXTRACTION       OB History   None      Home Medications    Prior to Admission medications   Medication Sig Start Date End Date Taking? Authorizing Provider  fluconazole (DIFLUCAN) 150 MG tablet Take 1 tablet (150 mg total) by mouth daily for 1 day. 12/14/17 12/15/17  Robinson, [place] N, PA-C  metroNIDAZOLE (FLAGYL) 500 MG tablet Take 1 tablet (500 mg total) by mouth 2 (two) times daily. 12/14/17   Robinson, [place] N, PA-C    Family History History reviewed. No pertinent family history.  Social History Social History   Tobacco Use  . Smoking status: Former Smoker    Types: Cigarettes  . Smokeless tobacco: Never Used  Substance Use Topics  .  Alcohol use: No  . Drug use: No     Allergies   Patient has no known allergies.   Review of Systems Review of Systems  Constitutional: Negative for chills and fever.  HENT: Positive for congestion. Negative for ear pain and sore throat.   Respiratory: Positive for cough. Negative for shortness of breath.   Genitourinary: Negative for vaginal bleeding and vaginal pain.       Vaginal "bump"  All other systems reviewed and are negative.    Physical Exam Updated Vital Signs BP (!) 151/116 (BP Location: Right Arm)   Pulse (!) 105   Temp 98.1 F (36.7 C) (Oral)   Resp 20   LMP 11/15/2017   SpO2 100%   Physical Exam  Constitutional: She appears well-developed and well-nourished. No distress.  Tolerating secretions  HENT:  Head: Normocephalic and atraumatic.  Mouth/Throat: Oropharynx is clear and moist.  Eyes: Conjunctivae are normal.  Neck: Normal range of motion. Neck supple.  Cardiovascular: Regular rhythm and normal heart sounds.  Pulmonary/Chest: Effort normal and breath sounds normal. No respiratory distress.  Abdominal: Soft. Bowel sounds are normal. She exhibits no distension. There is no tenderness.  Genitourinary:    Uterus is not enlarged and not tender. Cervix exhibits discharge. Cervix exhibits no motion tenderness. Right adnexum displays no mass and no tenderness. Left adnexum displays no mass and no tenderness. No tenderness in  the vagina. Vaginal discharge found.  Genitourinary Comments: Exam performed with female EMT chaperone present. Small round rubbery subcutaneous mass, about 5mm in diameter, in labia minora. Nontender, no erythema. Moderate-profuse amount of malodorous white discharge present  Lymphadenopathy:    She has no cervical adenopathy.  Neurological: She is alert.  Skin: Skin is warm.  Psychiatric: She has a normal mood and affect. Her behavior is normal.  Nursing note and vitals reviewed.    ED Treatments / Results  Labs (all labs  ordered are listed, but only abnormal results are displayed) Labs Reviewed  WET PREP, GENITAL - Abnormal; Notable for the following components:      Result Value   Clue Cells Wet Prep HPF POC PRESENT (*)    WBC, Wet Prep HPF POC MODERATE (*)    All other components within normal limits  GC/CHLAMYDIA PROBE AMP ([place]) NOT AT [facility]    EKG None  Radiology Dg Chest 2 View  Result Date: 12/14/2017 CLINICAL DATA:  Cough, congestion EXAM: CHEST - 2 VIEW COMPARISON:  05/15/2017 FINDINGS: Heart and mediastinal contours are within normal limits. No focal opacities or effusions. No acute bony abnormality. IMPRESSION: No active cardiopulmonary disease. Electronically Signed   By: Charlett Nose M.D.   On: 12/14/2017 10:33    Procedures Procedures (including critical care time)  Medications Ordered in ED Medications - No data to display   Initial Impression / Assessment and Plan / ED Course  I have reviewed the triage vital signs and the nursing notes.  Pertinent labs & imaging results that were available during my care of the patient were reviewed by me and considered in my medical decision making (see chart for details).     Patients symptoms are consistent with URI, likely viral etiology. Afebrile, tolerating secretions.  Lungs clear to auscultation bilaterally. CXR negative for acute infiltrate.  Discussed that antibiotics are not indicated for viral infections. Pt also with complaint of "bump" in vaginal area; on exam no evidence of abscess, small subcutaneous rubbery mass palpated - suspect it is cystic. Discharge present on exam, wet prep with clue cells and WBCs. Pt treated with flagyl for BV. She is aware she has STD cultures pending.  Pt will be discharged with symptomatic treatment for URI. Rx for diflucan also provided, as she states she gets yeast infections following abx. Verbalizes understanding and is agreeable with plan. Pt is hemodynamically stable & in NAD prior to  dc.  Discussed results, findings, treatment and follow up. Patient advised of return precautions. Patient verbalized understanding and agreed with plan.   Final Clinical Impressions(s) / ED Diagnoses   Final diagnoses:  Viral URI  Bacterial vaginosis    ED Discharge Orders        Ordered    metroNIDAZOLE (FLAGYL) 500 MG tablet  2 times daily     12/14/17 1406    fluconazole (DIFLUCAN) 150 MG tablet  Daily     12/14/17 1423       Robinson, [place] N, [place] 12/14/17 1426    Mesner, Barbara Cower, MD 12/14/17 1647

## 2017-12-14 NOTE — ED Notes (Signed)
Bad cough for a week coughing white and ellow sputum and a spt on her vagina x 3 day s

## 2017-12-14 NOTE — Discharge Instructions (Addendum)
Please read instructions below.  You can take tylenol as needed for sore throat. Use your albuterol inhaler as needed for cough.  Drink plenty of water.  Take the antibiotic, flagyl/metronidazole, as prescribed until it is gone. Do not drink alcohol with this medication as it will cause you to vomit. [facility], for your pap smear screenings, and to follow up on your visit today.  You will receive a call from the hospital if your test results come back positive. Avoid sexual activity until you know your test results. If your results come back positive, it is important that you inform all of your sexual partners.  Return to the ER for difficulty swallowing liquids, difficulty breathing, or new or worsening symptoms.

## 2017-12-17 LAB — GC/CHLAMYDIA PROBE AMP (~~LOC~~) NOT AT ARMC
CHLAMYDIA, DNA PROBE: NEGATIVE
NEISSERIA GONORRHEA: NEGATIVE

## 2018-02-12 ENCOUNTER — Encounter (HOSPITAL_COMMUNITY): Payer: Self-pay | Admitting: Family Medicine

## 2018-02-12 ENCOUNTER — Ambulatory Visit (HOSPITAL_COMMUNITY)
Admission: EM | Admit: 2018-02-12 | Discharge: 2018-02-12 | Disposition: A | Discharge status: Home / Self Care | Payer: BLUE CROSS/BLUE SHIELD | Attending: Family Medicine | Admitting: Family Medicine

## 2018-02-12 DIAGNOSIS — E282 Polycystic ovarian syndrome: Secondary | ICD-10-CM | POA: Insufficient documentation

## 2018-02-12 DIAGNOSIS — J45909 Unspecified asthma, uncomplicated: Secondary | ICD-10-CM | POA: Diagnosis not present

## 2018-02-12 DIAGNOSIS — N760 Acute vaginitis: Secondary | ICD-10-CM | POA: Diagnosis not present

## 2018-02-12 DIAGNOSIS — B9689 Other specified bacterial agents as the cause of diseases classified elsewhere: Secondary | ICD-10-CM

## 2018-02-12 DIAGNOSIS — Z87891 Personal history of nicotine dependence: Secondary | ICD-10-CM | POA: Diagnosis not present

## 2018-02-12 MED ORDER — METRONIDAZOLE 500 MG PO TABS: 500.0000 mg | ORAL_TABLET | Freq: Two times a day (BID) | ORAL | 0 refills | Status: DC

## 2018-02-12 NOTE — Discharge Instructions (Signed)
Take the metronidazole as directed Consider a probiotic daily We did lab testing during this visit.  If there are any abnormal findings that require change in medicine or indicate a positive result, you will be notified.  If all of your tests are normal, you will not be called.

## 2018-02-12 NOTE — ED Provider Notes (Signed)
[facility]    CSN: [id number] Arrival date & time: 02/12/18  1419     History   Chief Complaint Chief Complaint  Patient presents with  . Vaginal Discharge    HPI Amanda Shields is a 22 y.o. female.   HPI  Patient is here with a complaint of recurring BV.  She has a characteristic odor.  Slight discharge.  No irritation or itching.  No abdominal pain.  No urinary frequency or dysuria.  She states that she has not been sexually active.  She is not worried about an STD.  She went to the health department yesterday and had blood work for RPR and HIV.  She is here because they did not do urine testing or vaginitis treatment.   Past Medical History:  Diagnosis Date  . Abscess   . Asthma   . Endometriosis   . PCOS (polycystic ovarian syndrome)     There are no active problems to display for this patient.   Past Surgical History:  Procedure Laterality Date  . LAPAROSCOPIC OVARIAN    . WISDOM TOOTH EXTRACTION      OB History   None      Home Medications    Prior to Admission medications   Medication Sig Start Date End Date Taking? Authorizing Provider  metroNIDAZOLE (FLAGYL) 500 MG tablet Take 1 tablet (500 mg total) by mouth 2 (two) times daily. 02/12/18   Eustace Moore, MD    Family History History reviewed. No pertinent family history.  Social History Social History   Tobacco Use  . Smoking status: Former Smoker    Types: Cigarettes  . Smokeless tobacco: Never Used  Substance Use Topics  . Alcohol use: No  . Drug use: No     Allergies   Patient has no known allergies.   Review of Systems Review of Systems  Constitutional: Negative for chills and fever.  HENT: Negative for ear pain and sore throat.   Eyes: Negative for pain and visual disturbance.  Respiratory: Negative for cough and shortness of breath.   Cardiovascular: Negative for chest pain and palpitations.  Gastrointestinal: Negative for abdominal pain and vomiting.    Genitourinary: Negative for dysuria and hematuria.       Vaginal odor/irritation  Musculoskeletal: Negative for arthralgias and back pain.  Skin: Negative for color change and rash.  Neurological: Negative for seizures and syncope.  All other systems reviewed and are negative.    Physical Exam Triage Vital Signs ED Triage Vitals  Enc Vitals Group     BP 02/12/18 1527 (!) 147/85     Pulse Rate 02/12/18 1527 89     Resp 02/12/18 1527 18     Temp 02/12/18 1527 98.1 F (36.7 C)     Temp src --      SpO2 02/12/18 1527 100 %     Weight --      Height --      Head Circumference --      Peak Flow --      Pain Score 02/12/18 1525 0     Pain Loc --      Pain Edu? --      Excl. in GC? --    No data found.  Updated Vital Signs BP (!) 147/85   Pulse 89   Temp 98.1 F (36.7 C)   Resp 18   LMP 01/16/2018   SpO2 100%   Visual Acuity Right Eye Distance:   Left Eye Distance:  Bilateral Distance:    Right Eye Near:   Left Eye Near:    Bilateral Near:     Physical Exam  Constitutional: She appears well-developed and well-nourished. No distress.  HENT:  Head: Normocephalic and atraumatic.  Mouth/Throat: Oropharynx is clear and moist.  Eyes: Pupils are equal, round, and reactive to light. Conjunctivae are normal.  Neck: Normal range of motion.  Cardiovascular: Normal rate.  Pulmonary/Chest: Effort normal. No respiratory distress.  Abdominal: Soft. Bowel sounds are normal. She exhibits no distension. There is no tenderness.  Musculoskeletal: Normal range of motion. She exhibits no edema.  Neurological: She is alert.  Skin: Skin is warm and dry.  Psychiatric: She has a normal mood and affect. Her behavior is normal.     UC Treatments / Results  Labs (all labs ordered are listed, but only abnormal results are displayed) Labs Reviewed  URINE CYTOLOGY ANCILLARY ONLY    EKG None  Radiology No results found.  Procedures Procedures (including critical care  time)  Medications Ordered in UC Medications - No data to display  Initial Impression / Assessment and Plan / UC Course  I have reviewed the triage vital signs and the nursing notes.  Pertinent labs & imaging results that were available during my care of the patient were reviewed by me and considered in my medical decision making (see chart for details).     Discussed recurring BV.  Discussed STD.  Discussed safe sex.  Importance of taking full antibiotics.  Do not drink alcohol the antibiotic.  Possible prevention of recurrence by probiotics.  Research inconclusive but hopeful. Final Clinical Impressions(s) / UC Diagnoses   Final diagnoses:  Bacterial vaginosis     Discharge Instructions     Take the metronidazole as directed Consider a probiotic daily We did lab testing during this visit.  If there are any abnormal findings that require change in medicine or indicate a positive result, you will be notified.  If all of your tests are normal, you will not be called.      ED Prescriptions    Medication Sig Dispense Auth. Provider   metroNIDAZOLE (FLAGYL) 500 MG tablet Take 1 tablet (500 mg total) by mouth 2 (two) times daily. 14 tablet Eustace MooreNelson, Sharyon Peitz Sue, MD     Controlled Substance Prescriptions [place] Controlled Substance Registry consulted? Not Applicable   Eustace MooreNelson, Carle Dargan Sue, MD 02/12/18 [phone]

## 2018-02-12 NOTE — ED Triage Notes (Addendum)
Pt here for vaginal odor since Friday with slight discharge and itching.

## 2018-02-15 LAB — URINE CYTOLOGY ANCILLARY ONLY
Bacterial vaginitis: NEGATIVE
CANDIDA VAGINITIS: NEGATIVE

## 2018-03-03 ENCOUNTER — Emergency Department (HOSPITAL_COMMUNITY)
Admission: EM | Admit: 2018-03-03 | Discharge: 2018-03-03 | Disposition: A | Discharge status: Home / Self Care | Payer: BLUE CROSS/BLUE SHIELD | Attending: Emergency Medicine | Admitting: Emergency Medicine

## 2018-03-03 ENCOUNTER — Other Ambulatory Visit: Payer: Self-pay

## 2018-03-03 ENCOUNTER — Encounter (HOSPITAL_COMMUNITY): Payer: Self-pay | Admitting: Emergency Medicine

## 2018-03-03 DIAGNOSIS — Z87891 Personal history of nicotine dependence: Secondary | ICD-10-CM | POA: Insufficient documentation

## 2018-03-03 DIAGNOSIS — K529 Noninfective gastroenteritis and colitis, unspecified: Secondary | ICD-10-CM | POA: Diagnosis not present

## 2018-03-03 DIAGNOSIS — R112 Nausea with vomiting, unspecified: Secondary | ICD-10-CM | POA: Diagnosis present

## 2018-03-03 DIAGNOSIS — J45909 Unspecified asthma, uncomplicated: Secondary | ICD-10-CM | POA: Insufficient documentation

## 2018-03-03 LAB — I-STAT BETA HCG BLOOD, ED (MC, WL, AP ONLY): I-stat hCG, quantitative: <5 m[IU]/mL (ref <5)

## 2018-03-03 LAB — COMPREHENSIVE METABOLIC PANEL
ALBUMIN: 4.4 g/dL (ref 3.5–5.0)
ALK PHOS: 67 U/L (ref 38–126)
ALT: 12 U/L (ref 0–44)
AST: 17 U/L (ref 15–41)
Anion gap: 10 (ref 5–15)
BILIRUBIN TOTAL: 0.5 mg/dL (ref 0.3–1.2)
BUN: 9 mg/dL (ref 6–20)
CALCIUM: 9.5 mg/dL (ref 8.9–10.3)
CO2: 25 mmol/L (ref 22–32)
Chloride: 106 mmol/L (ref 98–111)
Creatinine, Ser: 0.73 mg/dL (ref 0.44–1.00)
GFR calc Af Amer: >60 mL/min (ref >60)
GFR calc non Af Amer: >60 mL/min (ref >60)
GLUCOSE: 104 mg/dL — AB (ref 70–99)
POTASSIUM: 3.9 mmol/L (ref 3.5–5.1)
Sodium: 141 mmol/L (ref 135–145)
Total Protein: 8 g/dL (ref 6.5–8.1)

## 2018-03-03 LAB — URINALYSIS, ROUTINE W REFLEX MICROSCOPIC
BILIRUBIN URINE: NEGATIVE
Glucose, UA: NEGATIVE mg/dL
HGB URINE DIPSTICK: NEGATIVE
KETONES UR: NEGATIVE mg/dL
Leukocytes, UA: NEGATIVE
NITRITE: NEGATIVE
PH: 8 (ref 5.0–8.0)
Protein, ur: NEGATIVE mg/dL
Specific Gravity, Urine: 1.023 (ref 1.005–1.030)

## 2018-03-03 LAB — CBC
HEMATOCRIT: 47.6 % — AB (ref 36.0–46.0)
Hemoglobin: 15.1 g/dL — ABNORMAL HIGH (ref 12.0–15.0)
MCH: 30.4 pg (ref 26.0–34.0)
MCHC: 31.7 g/dL (ref 30.0–36.0)
MCV: 96 fL (ref 78.0–100.0)
Platelets: 330 10*3/uL (ref 150–400)
RBC: 4.96 MIL/uL (ref 3.87–5.11)
RDW: 12.4 % (ref 11.5–15.5)
WBC: 11.3 10*3/uL — ABNORMAL HIGH (ref 4.0–10.5)

## 2018-03-03 LAB — LIPASE, BLOOD: Lipase: 22 U/L (ref 11–51)

## 2018-03-03 MED ORDER — SODIUM CHLORIDE 0.9 % IV BOLUS
1000.0000 mL | Freq: Once | INTRAVENOUS | Status: AC
  Administered 2018-03-03: 1000 mL via INTRAVENOUS

## 2018-03-03 MED ORDER — ONDANSETRON HCL 4 MG/2ML IJ SOLN
4.0000 mg | Freq: Once | INTRAMUSCULAR | Status: AC
  Administered 2018-03-03: 4 mg via INTRAVENOUS
  Filled 2018-03-03: qty 2

## 2018-03-03 MED ORDER — PROMETHAZINE HCL 25 MG PO TABS: 25.0000 mg | ORAL_TABLET | Freq: Three times a day (TID) | ORAL | 0 refills | Status: DC | PRN

## 2018-03-03 MED ORDER — ONDANSETRON 4 MG PO TBDP
4.0000 mg | ORAL_TABLET | Freq: Once | ORAL | Status: AC | PRN
  Administered 2018-03-03: 4 mg via ORAL
  Filled 2018-03-03: qty 1

## 2018-03-03 NOTE — ED Provider Notes (Signed)
[facility] EMERGENCY DEPARTMENT Provider Note   CSN: [id number] Arrival date & time: 03/03/18  7829     History   Chief Complaint Chief Complaint  Patient presents with  . Abdominal Pain  . Emesis  . Diarrhea    HPI Amanda Shields is a 22 y.o. female.  HPI Patient presents to the emergency department with nausea vomiting diarrhea that started last night.  The patient states she said multiple episodes of vomiting and diarrhea.  She states that she had stirfry for dinner last night.  The patient states she did not take any medications prior to arrival for her symptoms.  The patient denies chest pain, shortness of breath, headache,blurred vision, neck pain, fever, cough, weakness, numbness, dizziness, anorexia, edema,  rash, back pain, dysuria, hematemesis, bloody stool, near syncope, or syncope. Past Medical History:  Diagnosis Date  . Abscess   . Asthma   . Endometriosis   . PCOS (polycystic ovarian syndrome)     There are no active problems to display for this patient.   Past Surgical History:  Procedure Laterality Date  . LAPAROSCOPIC OVARIAN    . WISDOM TOOTH EXTRACTION       OB History   None      Home Medications    Prior to Admission medications   Medication Sig Start Date End Date Taking? Authorizing Provider  metroNIDAZOLE (FLAGYL) 500 MG tablet Take 1 tablet (500 mg total) by mouth 2 (two) times daily. 02/12/18   Eustace Moore, MD    Family History No family history on file.  Social History Social History   Tobacco Use  . Smoking status: Former Smoker    Types: Cigarettes  . Smokeless tobacco: Never Used  Substance Use Topics  . Alcohol use: No  . Drug use: No     Allergies   Patient has no known allergies.   Review of Systems Review of Systems All other systems negative except as documented in the HPI. All pertinent positives and negatives as reviewed in the HPI.  Physical Exam Updated Vital Signs BP  121/69   Pulse (!) 104   Temp 98.6 F (37 C)   Resp 17   Ht 5\' 8"  (1.727 m)   Wt 111.6 kg (246 lb)   LMP 02/16/2018   SpO2 99%   BMI 37.40 kg/m   Physical Exam  Constitutional: She is oriented to person, place, and time. She appears well-developed and well-nourished. No distress.  HENT:  Head: Normocephalic and atraumatic.  Mouth/Throat: Oropharynx is clear and moist.  Eyes: Pupils are equal, round, and reactive to light.  Neck: Normal range of motion. Neck supple.  Cardiovascular: Normal rate, regular rhythm and normal heart sounds. Exam reveals no gallop and no friction rub.  No murmur heard. Pulmonary/Chest: Effort normal and breath sounds normal. No respiratory distress. She has no wheezes.  Abdominal: Soft. Bowel sounds are normal. She exhibits no distension. There is no tenderness. There is no rigidity and no guarding.  Neurological: She is alert and oriented to person, place, and time. She exhibits normal muscle tone. Coordination normal.  Skin: Skin is warm and dry. Capillary refill takes less than 2 seconds. No rash noted. No erythema.  Psychiatric: She has a normal mood and affect. Her behavior is normal.  Nursing note and vitals reviewed.    ED Treatments / Results  Labs (all labs ordered are listed, but only abnormal results are displayed) Labs Reviewed  COMPREHENSIVE METABOLIC PANEL - Abnormal; Notable  for the following components:      Result Value   Glucose, Bld 104 (*)    All other components within normal limits  CBC - Abnormal; Notable for the following components:   WBC 11.3 (*)    Hemoglobin 15.1 (*)    HCT 47.6 (*)    All other components within normal limits  URINALYSIS, ROUTINE W REFLEX MICROSCOPIC - Abnormal; Notable for the following components:   APPearance HAZY (*)    All other components within normal limits  LIPASE, BLOOD  I-STAT BETA HCG BLOOD, ED ([facility], [facility], [facility] ONLY)    EKG None  Radiology No results found.  Procedures Procedures  (including critical care time)  Medications Ordered in ED Medications  ondansetron (ZOFRAN-ODT) disintegrating tablet 4 mg (4 mg Oral Given 03/03/18 0832)  sodium chloride 0.9 % bolus 1,000 mL (0 mLs Intravenous Stopped 03/03/18 1053)  sodium chloride 0.9 % bolus 1,000 mL (0 mLs Intravenous Stopped 03/03/18 1208)     Initial Impression / Assessment and Plan / ED Course  I have reviewed the triage vital signs and the nursing notes.  Pertinent labs & imaging results that were available during my care of the patient were reviewed by me and considered in my medical decision making (see chart for details).     Patient most likely has gastroenteritis based on the HPI and physical exam findings also the laboratory testing.  Patient to be treated for this.  She has tolerated oral fluids at this time.  Patient is advised to return for any worsening in her condition.  Told to increase her fluid intake and rest as much as possible. Final Clinical Impressions(s) / ED Diagnoses   Final diagnoses:  None    ED Discharge Orders    None       Charlestine NightLawyer, Jozlynn Plaia, PA-C 03/03/18 1441    Linwood DibblesKnapp, Jon, MD 03/03/18 [phone]

## 2018-03-03 NOTE — ED Triage Notes (Signed)
Pt states she has had emesis with generalized abdominal cramping that feels like period cramps and diarrhea since midnight. Pt has had 8 episodes of emesis and 4 of diarrhea.

## 2018-03-03 NOTE — Discharge Instructions (Addendum)
Return here as needed. Slowly increase your fluid intake. Rest as much as possible. °

## 2018-05-07 ENCOUNTER — Emergency Department (HOSPITAL_COMMUNITY)
Admission: EM | Admit: 2018-05-07 | Discharge: 2018-05-07 | Disposition: A | Discharge status: Home / Self Care | Payer: BLUE CROSS/BLUE SHIELD | Attending: Emergency Medicine | Admitting: Emergency Medicine

## 2018-05-07 ENCOUNTER — Encounter (HOSPITAL_COMMUNITY): Payer: Self-pay | Admitting: *Deleted

## 2018-05-07 DIAGNOSIS — N898 Other specified noninflammatory disorders of vagina: Secondary | ICD-10-CM | POA: Insufficient documentation

## 2018-05-07 DIAGNOSIS — J45909 Unspecified asthma, uncomplicated: Secondary | ICD-10-CM | POA: Insufficient documentation

## 2018-05-07 DIAGNOSIS — Z87891 Personal history of nicotine dependence: Secondary | ICD-10-CM | POA: Insufficient documentation

## 2018-05-07 LAB — URINALYSIS, ROUTINE W REFLEX MICROSCOPIC
BILIRUBIN URINE: NEGATIVE
GLUCOSE, UA: NEGATIVE mg/dL
Hgb urine dipstick: NEGATIVE
Ketones, ur: NEGATIVE mg/dL
Leukocytes, UA: NEGATIVE
Nitrite: NEGATIVE
PH: 5 (ref 5.0–8.0)
Protein, ur: NEGATIVE mg/dL
SPECIFIC GRAVITY, URINE: 1.024 (ref 1.005–1.030)

## 2018-05-07 LAB — WET PREP, GENITAL
Clue Cells Wet Prep HPF POC: NONE SEEN
SPERM: NONE SEEN
TRICH WET PREP: NONE SEEN
Yeast Wet Prep HPF POC: NONE SEEN

## 2018-05-07 LAB — POC URINE PREG, ED: Preg Test, Ur: NEGATIVE

## 2018-05-07 MED ORDER — CEFTRIAXONE SODIUM 250 MG IJ SOLR
250.0000 mg | Freq: Once | INTRAMUSCULAR | Status: AC
  Administered 2018-05-07: 250 mg via INTRAMUSCULAR
  Filled 2018-05-07: qty 250

## 2018-05-07 MED ORDER — LIDOCAINE HCL (PF) 1 % IJ SOLN
INTRAMUSCULAR | Status: AC
  Administered 2018-05-07: 5 mL
  Filled 2018-05-07: qty 5

## 2018-05-07 MED ORDER — AZITHROMYCIN 250 MG PO TABS
1000.0000 mg | ORAL_TABLET | Freq: Once | ORAL | Status: AC
  Administered 2018-05-07: 1000 mg via ORAL
  Filled 2018-05-07: qty 4

## 2018-05-07 NOTE — ED Provider Notes (Signed)
[facility] EMERGENCY DEPARTMENT Provider Note   CSN: [id number] Arrival date & time: 05/07/18  1253     History   Chief Complaint Chief Complaint  Patient presents with  . Vaginal Discharge    HPI Amanda Shields is a 23 y.o. female.  HPI   76 -year-old female presents today with complaints of vaginal discharge. She notes 1 week history of vaginal discharge. She denies any abdominal pain, pelvic pain, fever or chills nausea or vomiting. Patient and she has a history of bacterial vaginosis in the past. She is sexually active with female partner.   Past Medical History:  Diagnosis Date  . Abscess   . Asthma   . Endometriosis   . PCOS (polycystic ovarian syndrome)     There are no active problems to display for this patient.   Past Surgical History:  Procedure Laterality Date  . LAPAROSCOPIC OVARIAN    . WISDOM TOOTH EXTRACTION       OB History   None      Home Medications    Prior to Admission medications   Medication Sig Start Date End Date Taking? Authorizing Provider  metroNIDAZOLE (FLAGYL) 500 MG tablet Take 1 tablet (500 mg total) by mouth 2 (two) times daily. 02/12/18   Eustace Moore, MD  promethazine (PHENERGAN) 25 MG tablet Take 1 tablet (25 mg total) by mouth every 8 (eight) hours as needed for nausea or vomiting. 03/03/18   Charlestine Night, PA-C    Family History History reviewed. No pertinent family history.  Social History Social History   Tobacco Use  . Smoking status: Former Smoker    Types: Cigarettes  . Smokeless tobacco: Never Used  Substance Use Topics  . Alcohol use: No  . Drug use: No     Allergies   Patient has no known allergies.   Review of Systems Review of Systems  All other systems reviewed and are negative.   Physical Exam Updated Vital Signs BP 137/81   Pulse 75   Temp 98 F (36.7 C) (Oral)   Resp 20   SpO2 99%   Physical Exam  Constitutional: She is oriented to person, place,  and time. She appears well-developed and well-nourished.  HENT:  Head: Normocephalic and atraumatic.  Eyes: Pupils are equal, round, and reactive to light. Conjunctivae are normal. Right eye exhibits no discharge. Left eye exhibits no discharge. No scleral icterus.  Neck: Normal range of motion. No JVD present. No tracheal deviation present.  Pulmonary/Chest: Effort normal. No stridor.  Abdominal: Soft. She exhibits no distension and no mass. There is no tenderness. There is no rebound and no guarding. No hernia.  Genitourinary:  Genitourinary Comments: Thin purulent discharge noted in vaginal vault, no cervical motion adnexal tenderness or masses  Neurological: She is alert and oriented to person, place, and time. Coordination normal.  Psychiatric: She has a normal mood and affect. Her behavior is normal. Judgment and thought content normal.  Nursing note and vitals reviewed.   ED Treatments / Results  Labs (all labs ordered are listed, but only abnormal results are displayed) Labs Reviewed  WET PREP, GENITAL - Abnormal; Notable for the following components:      Result Value   WBC, Wet Prep HPF POC FEW (*)    All other components within normal limits  URINALYSIS, ROUTINE W REFLEX MICROSCOPIC  POC URINE PREG, ED  GC/CHLAMYDIA PROBE AMP ([place]) NOT AT [facility]    EKG None  Radiology No results  found.  Procedures Procedures (including critical care time)  Medications Ordered in ED Medications  cefTRIAXone (ROCEPHIN) injection 250 mg (250 mg Intramuscular Given 05/07/18 1616)  azithromycin (ZITHROMAX) tablet 1,000 mg (1,000 mg Oral Given 05/07/18 1616)  lidocaine (PF) (XYLOCAINE) 1 % injection (5 mLs  Given 05/07/18 1617)     Initial Impression / Assessment and Plan / ED Course  I have reviewed the triage vital signs and the nursing notes.  Pertinent labs & imaging results that were available during my care of the patient were reviewed by me and considered in my medical  decision making (see chart for details).     Labs: GC, wet prep  Imaging:  Consults:  Therapeutics:ceftriaxone, azithromycin  Discharge Meds:   Assessment/Plan: 53 YOF presents today with complaints vaginal discharge. Concern for STD, treated prophylactically, strict return precautions given. Patient verbalizes understanding and agreement to today's plan.      Final Clinical Impressions(s) / ED Diagnoses   Final diagnoses:  Vaginal discharge    ED Discharge Orders    None       Rosalio Loud 05/07/18 2122    Arby Barrette, MD 05/23/18 1046

## 2018-05-07 NOTE — ED Triage Notes (Signed)
Pt in c/o vaginal discharge and odor for the last few days, no distress noted

## 2018-05-07 NOTE — ED Notes (Signed)
Followed up on wet prep.  Lab has, states approx 10 mins left on it

## 2018-05-07 NOTE — ED Notes (Signed)
Pelvic exam with PA

## 2018-05-07 NOTE — Discharge Instructions (Addendum)
Please read attached information. If you experience any new or worsening signs or symptoms please return to the emergency room for evaluation. Please follow-up with your primary care provider or specialist as discussed.  °

## 2018-05-07 NOTE — ED Notes (Signed)
POC urine preg is NEGATIVE

## 2018-05-07 NOTE — ED Notes (Signed)
Patient able to ambulate independently  

## 2018-05-08 LAB — GC/CHLAMYDIA PROBE AMP (~~LOC~~) NOT AT ARMC
CHLAMYDIA, DNA PROBE: NEGATIVE
Neisseria Gonorrhea: NEGATIVE

## 2018-06-03 ENCOUNTER — Ambulatory Visit (HOSPITAL_COMMUNITY)
Admission: EM | Admit: 2018-06-03 | Discharge: 2018-06-03 | Disposition: A | Discharge status: Home / Self Care | Payer: Medicaid Other | Attending: Family Medicine | Admitting: Family Medicine

## 2018-06-03 ENCOUNTER — Encounter (HOSPITAL_COMMUNITY): Payer: Self-pay | Admitting: Emergency Medicine

## 2018-06-03 DIAGNOSIS — Z87891 Personal history of nicotine dependence: Secondary | ICD-10-CM | POA: Insufficient documentation

## 2018-06-03 DIAGNOSIS — N898 Other specified noninflammatory disorders of vagina: Secondary | ICD-10-CM

## 2018-06-03 NOTE — ED Provider Notes (Signed)
[facility]    CSN: [id number] Arrival date & time: 06/03/18  1747     History   Chief Complaint Chief Complaint  Patient presents with  . Vaginal Discharge    HPI Amanda Shields is a 22 y.o. female.   Amanda Shields presents with complaints of vaginal discharge with odor which has been ongoing for a few weeks now. Went to ER 9/3 with negative testing, followed by seeing the health department with negative testing. States has had BV in the past and seems similar. No bleeding. No pelvic pain or cramping. Slight occasional itching. LMP 9/17. No urinary symptoms. Has one partner and has not been using condoms. Does not have a local gynecologist, in town for school. Hx of abscess, endometriosis, PCOS.    ROS per HPI.      Past Medical History:  Diagnosis Date  . Abscess   . Asthma   . Endometriosis   . PCOS (polycystic ovarian syndrome)     There are no active problems to display for this patient.   Past Surgical History:  Procedure Laterality Date  . LAPAROSCOPIC OVARIAN    . WISDOM TOOTH EXTRACTION      OB History   None      Home Medications    Prior to Admission medications   Medication Sig Start Date End Date Taking? Authorizing Provider  metroNIDAZOLE (FLAGYL) 500 MG tablet Take 1 tablet (500 mg total) by mouth 2 (two) times daily. 02/12/18   Eustace Moore, MD  promethazine (PHENERGAN) 25 MG tablet Take 1 tablet (25 mg total) by mouth every 8 (eight) hours as needed for nausea or vomiting. 03/03/18   Charlestine Night, PA-C    Family History No family history on file.  Social History Social History   Tobacco Use  . Smoking status: Former Smoker    Types: Cigarettes  . Smokeless tobacco: Never Used  Substance Use Topics  . Alcohol use: No  . Drug use: No     Allergies   Patient has no known allergies.   Review of Systems Review of Systems   Physical Exam Triage Vital Signs ED Triage Vitals  Enc Vitals Group     BP  06/03/18 1843 (!) 143/92     Pulse Rate 06/03/18 1843 89     Resp 06/03/18 1843 16     Temp 06/03/18 1843 99 F (37.2 C)     Temp Source 06/03/18 1843 Oral     SpO2 06/03/18 1843 100 %     Weight --      Height --      Head Circumference --      Peak Flow --      Pain Score 06/03/18 1844 0     Pain Loc --      Pain Edu? --      Excl. in GC? --    No data found.  Updated Vital Signs BP (!) 143/92   Pulse 89   Temp 99 F (37.2 C) (Oral)   Resp 16   LMP 05/21/2018   SpO2 100%    Physical Exam  Constitutional: She is oriented to person, place, and time. She appears well-developed and well-nourished. No distress.  Cardiovascular: Normal rate, regular rhythm and normal heart sounds.  Pulmonary/Chest: Effort normal and breath sounds normal.  Abdominal: Soft. There is no tenderness. There is no rigidity, no rebound, no guarding and no CVA tenderness.  Genitourinary: Vagina normal and uterus normal. Cervix exhibits no motion tenderness.  Genitourinary Comments: Thin clear cervical discharge with os slightly opened; no other discharge or obvious odor noted; no pain no bleeding   Neurological: She is alert and oriented to person, place, and time.  Skin: Skin is warm and dry.     UC Treatments / Results  Labs (all labs ordered are listed, but only abnormal results are displayed) Labs Reviewed  CERVICOVAGINAL ANCILLARY ONLY    EKG None  Radiology No results found.  Procedures Procedures (including critical care time)  Medications Ordered in UC Medications - No data to display  Initial Impression / Assessment and Plan / UC Course  I have reviewed the triage vital signs and the nursing notes.  Pertinent labs & imaging results that were available during my care of the patient were reviewed by me and considered in my medical decision making (see chart for details).     Normal pelvic exam. Recent negative GU screening tests for similar. Vaginal swab recollected today.  No acute findings on exam concerning for BV. Will notify of any positive findings and if any changes to treatment are needed.  No treatment at this time. Encouraged establish with gynecologist for recheck and management. Patient verbalized understanding and agreeable to plan.   Final Clinical Impressions(s) / UC Diagnoses   Final diagnoses:  Vaginal discharge     Discharge Instructions     Your vaginal exam was normal today which is a good start.  We have testing you today again for sources of cause for vaginal discharge or odor.  Will notify you of any positive findings and if any changes to treatment are needed.   Avoid baths, douching, soaps in vagina.  Please establish with a gynecology for recheck of any persistent symptoms.    ED Prescriptions    None     Controlled Substance Prescriptions [place] Controlled Substance Registry consulted? Not Applicable   Georgetta Haber, NP 06/03/18 Corky Crafts

## 2018-06-03 NOTE — Discharge Instructions (Signed)
Your vaginal exam was normal today which is a good start.  We have testing you today again for sources of cause for vaginal discharge or odor.  Will notify you of any positive findings and if any changes to treatment are needed.   Avoid baths, douching, soaps in vagina.  Please establish with a gynecology for recheck of any persistent symptoms.

## 2018-06-03 NOTE — ED Triage Notes (Signed)
PT reports vaginal discharge and odor. PT was seen at ED 6 weeks ago and had neg workup. Was seen at health dept as well with neg work up

## 2018-06-04 LAB — CERVICOVAGINAL ANCILLARY ONLY
Bacterial vaginitis: POSITIVE — AB
Candida vaginitis: NEGATIVE
Chlamydia: NEGATIVE
Neisseria Gonorrhea: NEGATIVE
Trichomonas: NEGATIVE

## 2018-06-06 ENCOUNTER — Telehealth (HOSPITAL_COMMUNITY): Payer: Self-pay

## 2018-06-06 MED ORDER — METRONIDAZOLE 500 MG PO TABS: 500.0000 mg | ORAL_TABLET | Freq: Two times a day (BID) | ORAL | 0 refills | Status: DC

## 2018-06-06 NOTE — Telephone Encounter (Signed)
Bacterial vaginosis is positive. This was not treated at the urgent care visit.  Flagyl 500 mg BID x 7 days #14 no refills sent to patients pharmacy of choice.  Pt called and made aware of results and new prescription. Answered all questions and pt verbalized understanding.  

## 2018-08-07 ENCOUNTER — Ambulatory Visit (HOSPITAL_COMMUNITY)
Admission: EM | Admit: 2018-08-07 | Discharge: 2018-08-07 | Disposition: A | Discharge status: Home / Self Care | Payer: BLUE CROSS/BLUE SHIELD | Attending: Family Medicine | Admitting: Family Medicine

## 2018-08-07 ENCOUNTER — Encounter (HOSPITAL_COMMUNITY): Payer: Self-pay | Admitting: Emergency Medicine

## 2018-08-07 ENCOUNTER — Other Ambulatory Visit: Payer: Self-pay

## 2018-08-07 DIAGNOSIS — Z202 Contact with and (suspected) exposure to infections with a predominantly sexual mode of transmission: Secondary | ICD-10-CM | POA: Diagnosis not present

## 2018-08-07 DIAGNOSIS — Z87891 Personal history of nicotine dependence: Secondary | ICD-10-CM | POA: Insufficient documentation

## 2018-08-07 DIAGNOSIS — Z113 Encounter for screening for infections with a predominantly sexual mode of transmission: Secondary | ICD-10-CM | POA: Diagnosis not present

## 2018-08-07 DIAGNOSIS — Z3202 Encounter for pregnancy test, result negative: Secondary | ICD-10-CM

## 2018-08-07 DIAGNOSIS — N898 Other specified noninflammatory disorders of vagina: Secondary | ICD-10-CM

## 2018-08-07 LAB — POCT URINALYSIS DIP (DEVICE)
Bilirubin Urine: NEGATIVE
Glucose, UA: NEGATIVE mg/dL
Hgb urine dipstick: NEGATIVE
KETONES UR: NEGATIVE mg/dL
Leukocytes, UA: NEGATIVE
Nitrite: NEGATIVE
PH: 6.5 (ref 5.0–8.0)
PROTEIN: NEGATIVE mg/dL
Specific Gravity, Urine: 1.02 (ref 1.005–1.030)
Urobilinogen, UA: 0.2 mg/dL (ref 0.0–1.0)

## 2018-08-07 LAB — POCT PREGNANCY, URINE: PREG TEST UR: NEGATIVE

## 2018-08-07 MED ORDER — METRONIDAZOLE 500 MG PO TABS: 500.0000 mg | ORAL_TABLET | Freq: Two times a day (BID) | ORAL | 0 refills | Status: DC

## 2018-08-07 MED ORDER — CEFTRIAXONE SODIUM 250 MG IJ SOLR
250.0000 mg | Freq: Once | INTRAMUSCULAR | Status: AC
  Administered 2018-08-07: 250 mg via INTRAMUSCULAR

## 2018-08-07 MED ORDER — AZITHROMYCIN 250 MG PO TABS
ORAL_TABLET | ORAL | Status: AC
  Filled 2018-08-07: qty 4

## 2018-08-07 MED ORDER — CEFTRIAXONE SODIUM 250 MG IJ SOLR
INTRAMUSCULAR | Status: AC
  Filled 2018-08-07: qty 250

## 2018-08-07 MED ORDER — AZITHROMYCIN 250 MG PO TABS
1000.0000 mg | ORAL_TABLET | Freq: Once | ORAL | Status: AC
  Administered 2018-08-07: 1000 mg via ORAL

## 2018-08-07 NOTE — ED Provider Notes (Signed)
[facility]   [id number] 08/07/18 Arrival Time: 1824   NW:[id number]:VAGINAL itching/irritation with + STI exposure  SUBJECTIVE:  Amanda AlbrightJasmine Shields is a 22 y.o. female who presents with complaints of mild vaginal itching/ irritation x 5 days ago.  Last unprotected sex 1 week ago.  Patient has been sexually active with 2 female partners within the last 6 months.  Partner with possible STD.  Partner called and recommended patient get tested for STDs today.  She has NOT tried OTC medications.  She reports worsening symptoms with walking.  She reports similar symptoms in the past and was diagnosed with BV.  She denies fever, chills, nausea, vomiting, abdominal or pelvic pain, urinary symptoms, vaginal odor, vaginal bleeding, dyspareunia, vaginal rashes or lesions.   Patient's last menstrual period was 07/25/2018 (exact date). Current birth control method: None  ROS: As per HPI.  Past Medical History:  Diagnosis Date  . Abscess   . Asthma   . Endometriosis   . PCOS (polycystic ovarian syndrome)    Past Surgical History:  Procedure Laterality Date  . LAPAROSCOPIC OVARIAN    . WISDOM TOOTH EXTRACTION     No Known Allergies No current facility-administered medications on file prior to encounter.    No current outpatient medications on file prior to encounter.    Social History   Socioeconomic History  . Marital status: Single    Spouse name: Not on file  . Number of children: Not on file  . Years of education: Not on file  . Highest education level: Not on file  Occupational History  . Not on file  Social Needs  . Financial resource strain: Not on file  . Food insecurity:    Worry: Not on file    Inability: Not on file  . Transportation needs:    Medical: Not on file    Non-medical: Not on file  Tobacco Use  . Smoking status: Former Smoker    Types: Cigarettes  . Smokeless tobacco: Never Used  Substance and Sexual Activity  . Alcohol use: No  . Drug use: No  . Sexual  activity: Not on file  Lifestyle  . Physical activity:    Days per week: Not on file    Minutes per session: Not on file  . Stress: Not on file  Relationships  . Social connections:    Talks on phone: Not on file    Gets together: Not on file    Attends religious service: Not on file    Active member of club or organization: Not on file    Attends meetings of clubs or organizations: Not on file    Relationship status: Not on file  . Intimate partner violence:    Fear of current or ex partner: Not on file    Emotionally abused: Not on file    Physically abused: Not on file    Forced sexual activity: Not on file  Other Topics Concern  . Not on file  Social History Narrative  . Not on file   History reviewed. No pertinent family history.  OBJECTIVE:  Vitals:   08/07/18 1950  BP: 130/77  Pulse: 82  Temp: 98.5 F (36.9 C)  TempSrc: Oral  SpO2: 100%     General appearance: Alert, NAD, appears stated age Head: NCAT Eyes: PERRL Throat: lips, mucosa, and tongue normal; teeth and gums normal Lungs: CTA bilaterally without adventitious breath sounds Heart: regular rate and rhythm.  Radial pulses 2+ symmetrical bilaterally Back: no CVA  tenderness Abdomen: soft, non-tender; bowel sounds normal; no guarding  GU: deferred; vaginal self-swab obtained Skin: warm and dry Psychological:  Alert and cooperative. Normal mood and affect.  LABS:  Results for orders placed or performed during the hospital encounter of 08/07/18  POCT urinalysis dip (device)  Result Value Ref Range   Glucose, UA NEGATIVE NEGATIVE mg/dL   Bilirubin Urine NEGATIVE NEGATIVE   Ketones, ur NEGATIVE NEGATIVE mg/dL   Specific Gravity, Urine 1.020 1.005 - 1.030   Hgb urine dipstick NEGATIVE NEGATIVE   pH 6.5 5.0 - 8.0   Protein, ur NEGATIVE NEGATIVE mg/dL   Urobilinogen, UA 0.2 0.0 - 1.0 mg/dL   Nitrite NEGATIVE NEGATIVE   Leukocytes, UA NEGATIVE NEGATIVE  Pregnancy, urine POC  Result Value Ref Range    Preg Test, Ur NEGATIVE NEGATIVE    Labs Reviewed  POCT URINALYSIS DIP (DEVICE)  POCT PREGNANCY, URINE  CERVICOVAGINAL ANCILLARY ONLY    ASSESSMENT & PLAN:  1. Exposure to STD   2. Vaginal irritation   3. Vaginal itching     Meds ordered this encounter  Medications  . cefTRIAXone (ROCEPHIN) injection 250 mg  . azithromycin (ZITHROMAX) tablet 1,000 mg  . metroNIDAZOLE (FLAGYL) 500 MG tablet    Sig: Take 1 tablet (500 mg total) by mouth 2 (two) times daily.    Dispense:  14 tablet    Refill:  0    Order Specific Question:   Supervising Provider    Answer:   Eustace Moore [[id number]]    Pending: Labs Reviewed  POCT URINALYSIS DIP (DEVICE)  POCT PREGNANCY, URINE  CERVICOVAGINAL ANCILLARY ONLY   Vaginal self-swab obtained Given rocephin 250mg  injection and azithromycin 1g in office Declines HIV/ syphilis testing today Prescribed metronidazole 500 mg twice daily for 7 days (do not take while consuming alcohol and/or if breastfeeding) Take medication as prescribed and to completion We will follow up with you regarding the results of your test If tests are positive, please abstain from sexual activity for at least 7 days and notify partners Follow up with PCP if symptoms persists Return here or go to ER if you have any new or worsening symptoms fever, chills, nausea, vomiting, abdominal or pelvic pain, painful intercourse, vaginal discharge, vaginal bleeding, persistent symptoms despite treatment, etc...  Reviewed expectations re: course of current medical issues. Questions answered. Outlined signs and symptoms indicating need for more acute intervention. Patient verbalized understanding. After Visit Summary given.       Rennis Harding, PA-C 08/07/18 2027

## 2018-08-07 NOTE — ED Triage Notes (Signed)
Pt is having some mild vaginal itching and her partner called today and stated he was checked today for STD's and recommended she come and get checked as well.  He did not state that he was positive for anything.

## 2018-08-07 NOTE — Discharge Instructions (Signed)
Vaginal self-swab obtained °Given rocephin 250mg injection and azithromycin 1g in office °Declines HIV/ syphilis testing today °Prescribed metronidazole 500 mg twice daily for 7 days (do not take while consuming alcohol and/or if breastfeeding) °Take medication as prescribed and to completion °We will follow up with you regarding the results of your test °If tests are positive, please abstain from sexual activity for at least 7 days and notify partners °Follow up with PCP if symptoms persists °Return here or go to ER if you have any new or worsening symptoms fever, chills, nausea, vomiting, abdominal or pelvic pain, painful intercourse, vaginal discharge, vaginal bleeding, persistent symptoms despite treatment, etc... °

## 2018-08-08 LAB — CERVICOVAGINAL ANCILLARY ONLY
Bacterial vaginitis: NEGATIVE
CANDIDA VAGINITIS: NEGATIVE
CHLAMYDIA, DNA PROBE: NEGATIVE
Neisseria Gonorrhea: NEGATIVE
Trichomonas: NEGATIVE

## 2018-10-04 IMAGING — CR DG CHEST 2V
2 series · 2 of 2 positions shown · non-contrast
Comparison: None in PACs

CLINICAL DATA: Cough, shortness of breath, and vomiting. History of
childhood asthma. Former smoker.

EXAM:
CHEST  2 VIEW

[chest pa]
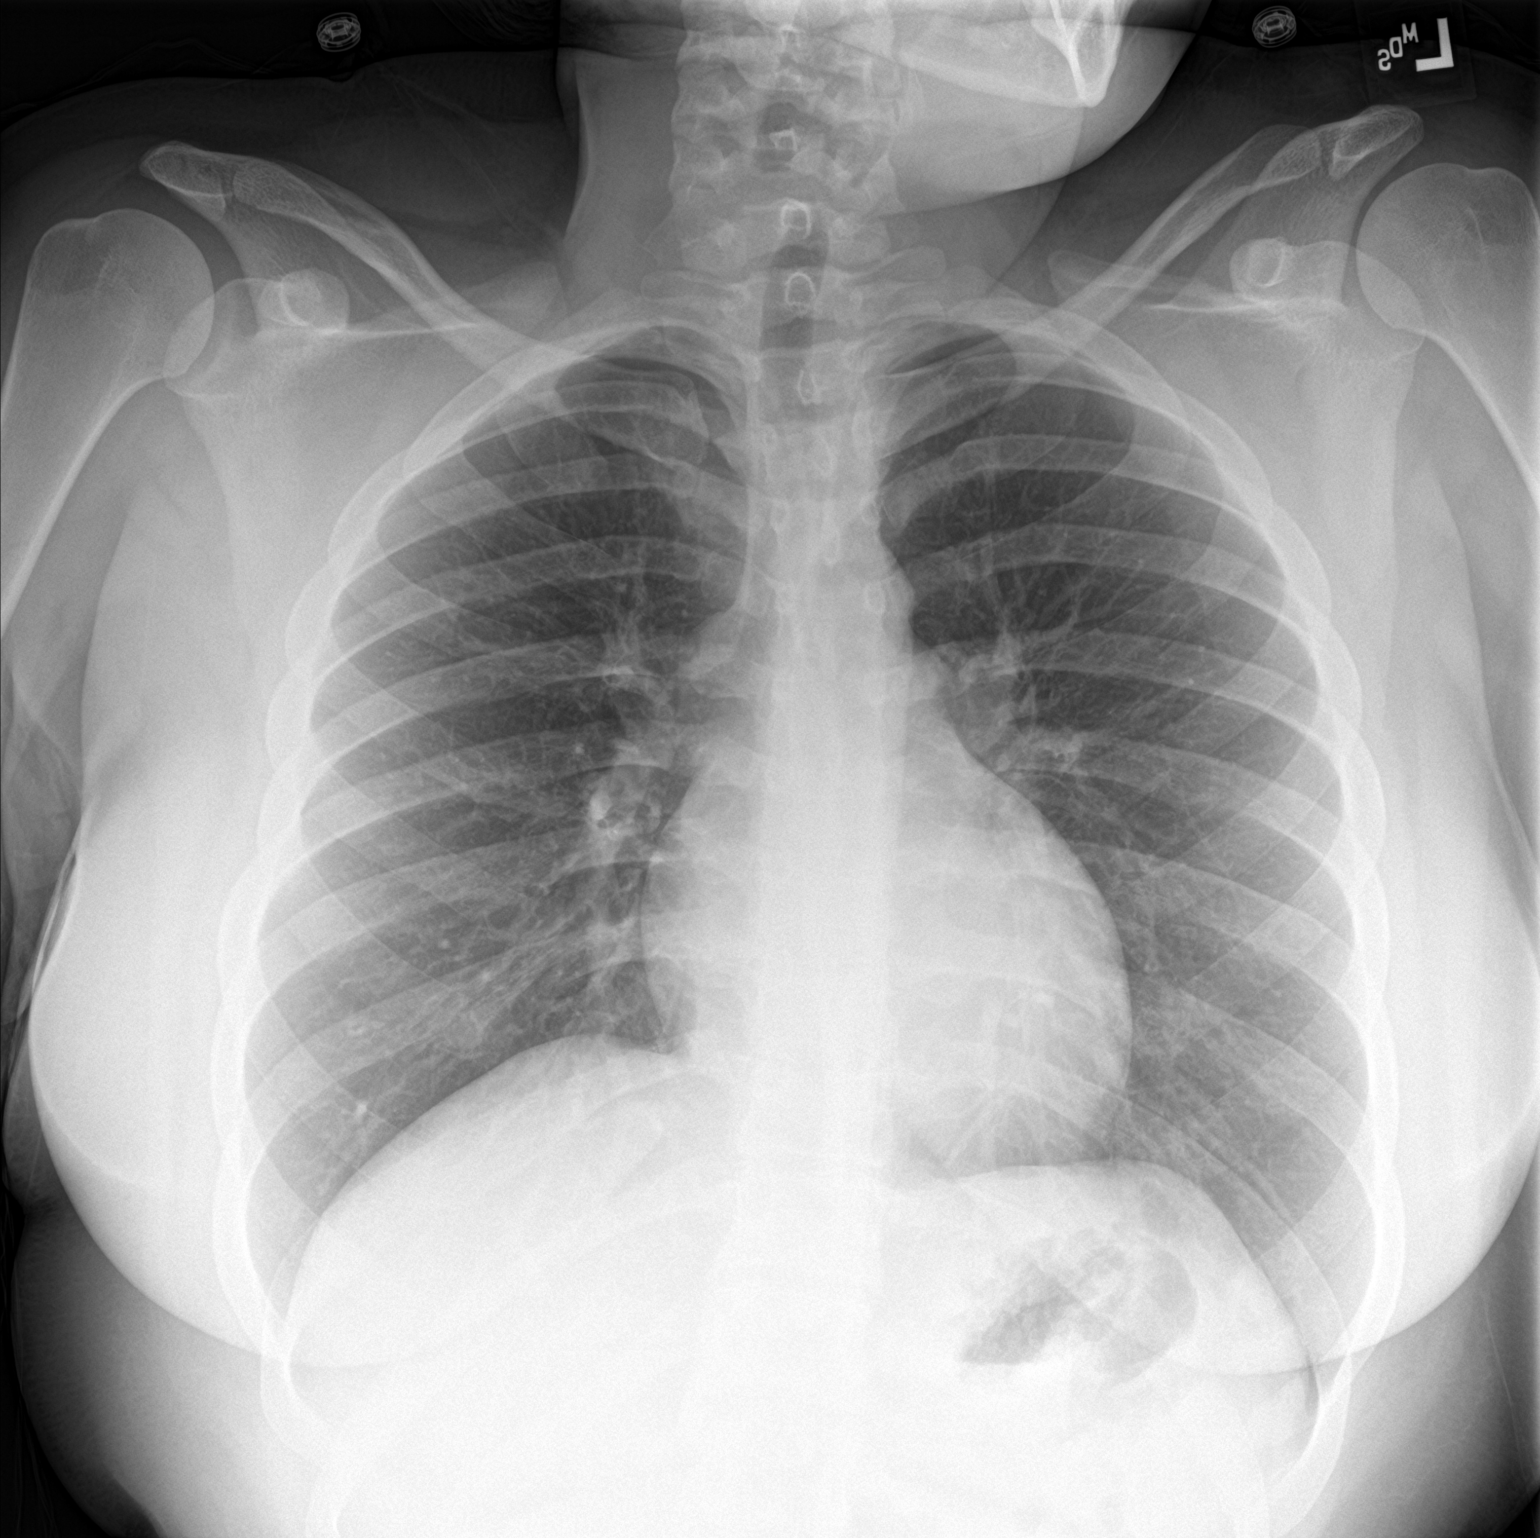

[chest lat]
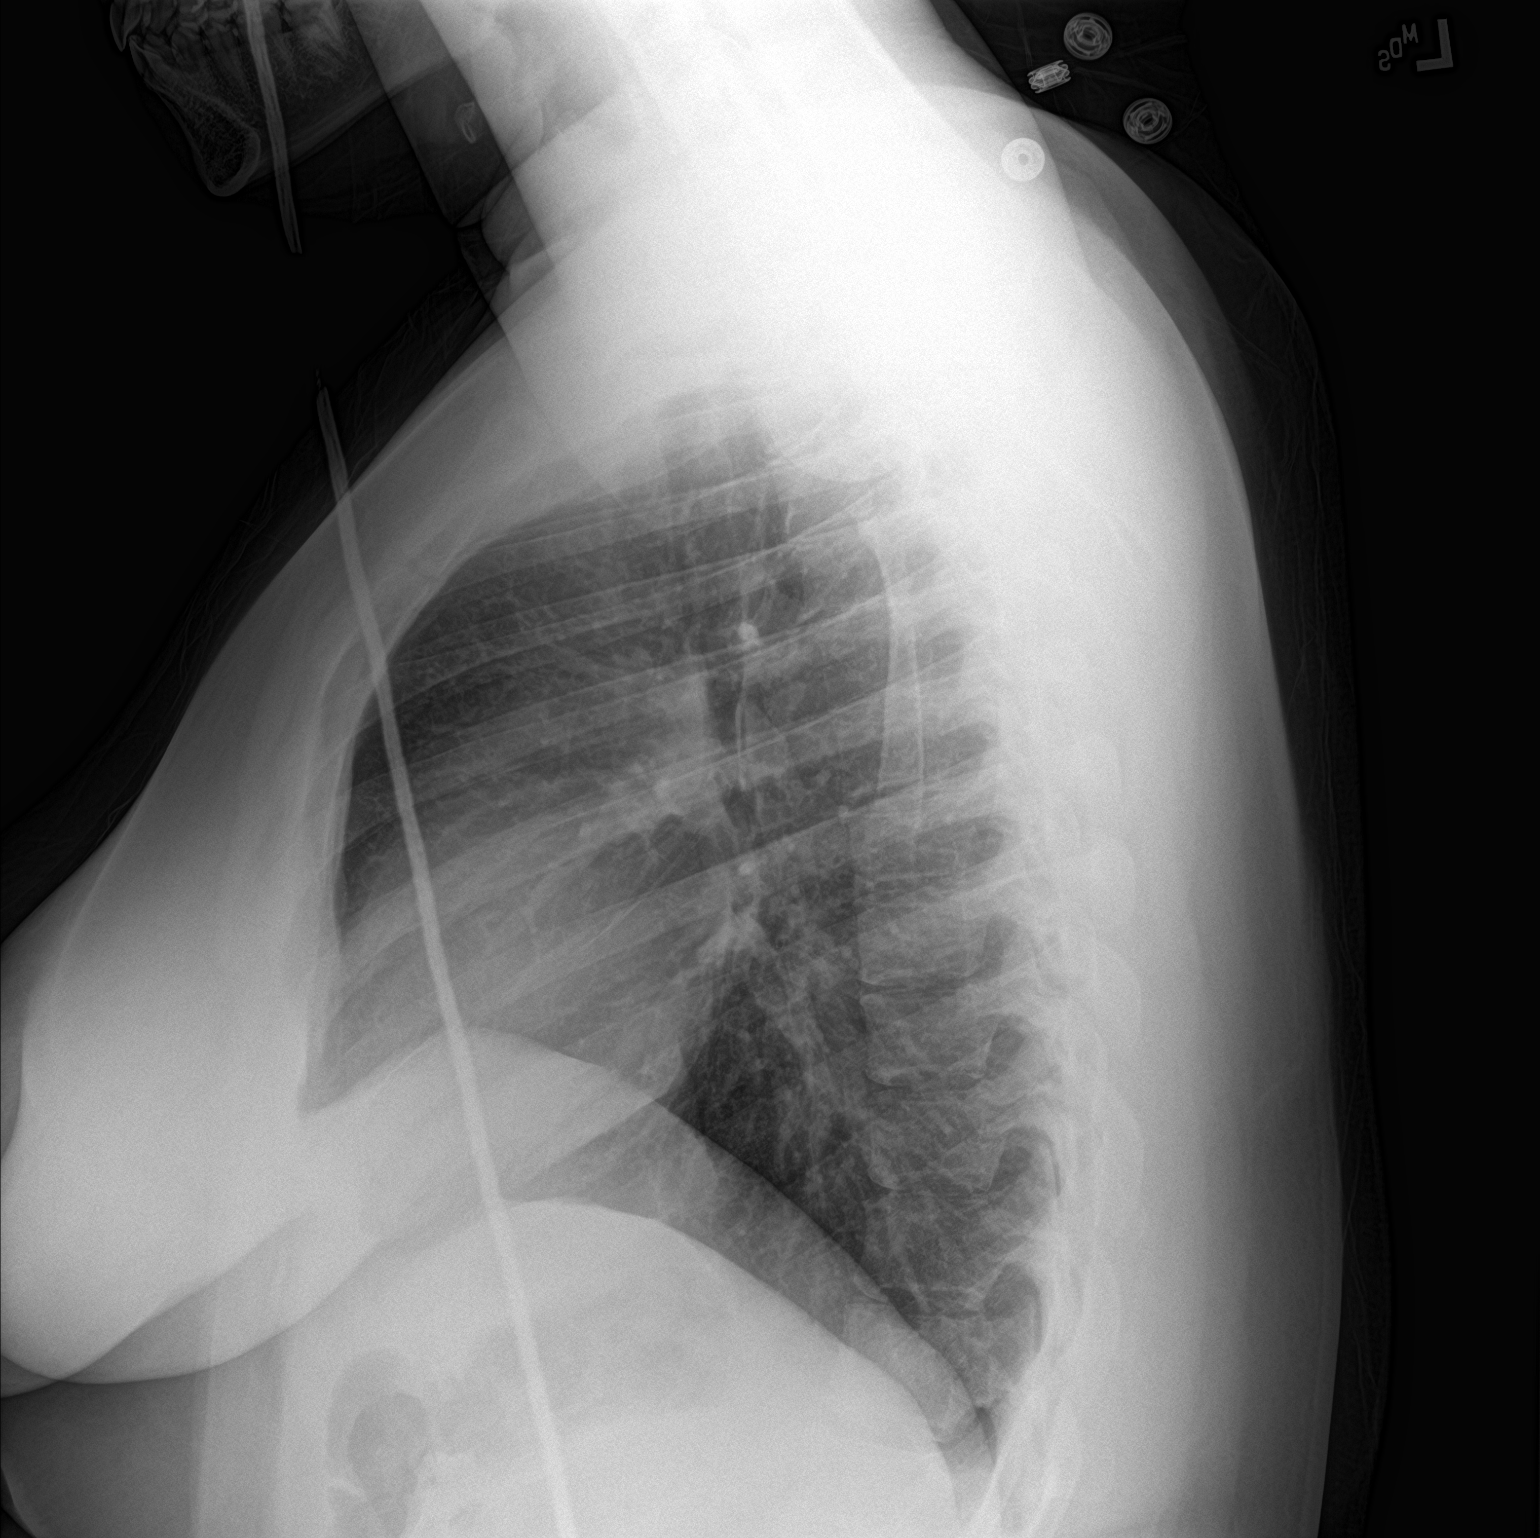

[2 of 2 positions shown; findings below may reference images not displayed]

FINDINGS: The lungs are well-expanded and clear. The heart and pulmonary
vascularity are normal. The mediastinum is normal in width. There is
no pleural effusion. The bony thorax exhibits no acute abnormality.
IMPRESSION: There is no active cardiopulmonary disease.

## 2018-11-06 ENCOUNTER — Other Ambulatory Visit: Payer: Self-pay

## 2018-11-06 ENCOUNTER — Encounter (HOSPITAL_COMMUNITY): Payer: Self-pay | Admitting: Emergency Medicine

## 2018-11-06 ENCOUNTER — Ambulatory Visit (HOSPITAL_COMMUNITY)
Admission: EM | Admit: 2018-11-06 | Discharge: 2018-11-06 | Disposition: A | Discharge status: Home / Self Care | Payer: BLUE CROSS/BLUE SHIELD | Attending: Family Medicine | Admitting: Family Medicine

## 2018-11-06 DIAGNOSIS — Z3202 Encounter for pregnancy test, result negative: Secondary | ICD-10-CM

## 2018-11-06 DIAGNOSIS — Z113 Encounter for screening for infections with a predominantly sexual mode of transmission: Secondary | ICD-10-CM

## 2018-11-06 DIAGNOSIS — J029 Acute pharyngitis, unspecified: Secondary | ICD-10-CM

## 2018-11-06 DIAGNOSIS — H9201 Otalgia, right ear: Secondary | ICD-10-CM

## 2018-11-06 DIAGNOSIS — N898 Other specified noninflammatory disorders of vagina: Secondary | ICD-10-CM | POA: Insufficient documentation

## 2018-11-06 DIAGNOSIS — Z202 Contact with and (suspected) exposure to infections with a predominantly sexual mode of transmission: Secondary | ICD-10-CM

## 2018-11-06 DIAGNOSIS — H6981 Other specified disorders of Eustachian tube, right ear: Secondary | ICD-10-CM

## 2018-11-06 MED ORDER — AZITHROMYCIN 250 MG PO TABS
1000.0000 mg | ORAL_TABLET | Freq: Once | ORAL | Status: AC
  Administered 2018-11-06: 1000 mg via ORAL

## 2018-11-06 MED ORDER — CEFTRIAXONE SODIUM 250 MG IJ SOLR
INTRAMUSCULAR | Status: AC
  Filled 2018-11-06: qty 250

## 2018-11-06 MED ORDER — METRONIDAZOLE 500 MG PO TABS: 500.0000 mg | ORAL_TABLET | Freq: Two times a day (BID) | ORAL | 0 refills | Status: DC

## 2018-11-06 MED ORDER — AZITHROMYCIN 250 MG PO TABS
ORAL_TABLET | ORAL | Status: AC
  Filled 2018-11-06: qty 4

## 2018-11-06 MED ORDER — CEFTRIAXONE SODIUM 250 MG IJ SOLR
250.0000 mg | Freq: Once | INTRAMUSCULAR | Status: AC
  Administered 2018-11-06: 250 mg via INTRAMUSCULAR

## 2018-11-06 MED ORDER — FLUCONAZOLE 200 MG PO TABS: ORAL_TABLET | ORAL | 0 refills | Status: DC

## 2018-11-06 NOTE — Discharge Instructions (Addendum)
Vaginal self-swab obtained Given rocephin 250mg  injection and azithromycin 1g in office HIV/ syphilis testing today Prescribed metronidazole 500 mg twice daily for 7 days (do not take while consuming alcohol and/or if breastfeeding) Prescribed diflucan 200 mg once daily and then second dose after you finish taking metronidazole Take medications as prescribed and to completion We will follow up with you regarding the results of your test If tests are positive, please abstain from sexual activity for at least 7 days and notify partners Follow up with PCP or with [facility] if symptoms persists Return here or go to ER if you have any new or worsening symptoms fever, chills, nausea, vomiting, abdominal or pelvic pain, painful intercourse, vaginal discharge, vaginal bleeding, persistent symptoms despite treatment, etc...  Begin taking OTC zyrtec and flonase for ear and throat discomfort Return if symptoms persists

## 2018-11-06 NOTE — ED Provider Notes (Signed)
[facility]   [id number] 11/06/18 Arrival Time: 1818   VC:BSWHQPR itching  SUBJECTIVE:  Amanda Shields is a 23 y.o. female who presents with complaint of vaginal itching x 3 weeks.  She denies a precipitating event, recent sexual encounter or recent antibiotic use.  Patient is sexually active with 1 female partner. She has tried OTC medications with minimal relief.  She reports similar symptoms in the past and diagnosed with yeast infection and BV.  Initially have vaginal discharge and odor, but now resolved.  She denies fever, chills, nausea, vomiting, abdominal or pelvic pain, urinary symptoms, vaginal bleeding, dyspareunia, vaginal rashes or lesions.   Patient's last menstrual period was 10/25/2018.  Also mentions RT ear and throat discomfort x 1 day.  Denies sick exposure.  Has not tried OTC medications.   ROS: As per HPI.  Past Medical History:  Diagnosis Date  . Abscess   . Asthma   . Endometriosis   . PCOS (polycystic ovarian syndrome)    Past Surgical History:  Procedure Laterality Date  . LAPAROSCOPIC OVARIAN    . WISDOM TOOTH EXTRACTION     No Known Allergies No current facility-administered medications on file prior to encounter.    No current outpatient medications on file prior to encounter.    Social History   Socioeconomic History  . Marital status: Single    Spouse name: Not on file  . Number of children: Not on file  . Years of education: Not on file  . Highest education level: Not on file  Occupational History  . Not on file  Social Needs  . Financial resource strain: Not on file  . Food insecurity:    Worry: Not on file    Inability: Not on file  . Transportation needs:    Medical: Not on file    Non-medical: Not on file  Tobacco Use  . Smoking status: Former Smoker    Types: Cigarettes  . Smokeless tobacco: Never Used  Substance and Sexual Activity  . Alcohol use: No  . Drug use: No  . Sexual activity: Not on file  Lifestyle    . Physical activity:    Days per week: Not on file    Minutes per session: Not on file  . Stress: Not on file  Relationships  . Social connections:    Talks on phone: Not on file    Gets together: Not on file    Attends religious service: Not on file    Active member of club or organization: Not on file    Attends meetings of clubs or organizations: Not on file    Relationship status: Not on file  . Intimate partner violence:    Fear of current or ex partner: Not on file    Emotionally abused: Not on file    Physically abused: Not on file    Forced sexual activity: Not on file  Other Topics Concern  . Not on file  Social History Narrative  . Not on file   History reviewed. No pertinent family history.  OBJECTIVE:  Vitals:   11/06/18 1953  BP: (!) 149/79  Pulse: 85  Resp: 16  Temp: 98.6 F (37 C)  TempSrc: Temporal  SpO2: 100%     General appearance: Alert, NAD, appears stated age Head: NCAT Ear: RT EAC clear, TM pearly gray Throat: lips, mucosa, and tongue normal; teeth and gums normal; oropharynx clear, tonsils mildly enlarged but not erythematous, uvula midline  Lungs: CTA bilaterally without  adventitious breath sounds Heart: regular rate and rhythm.  Radial pulses 2+ symmetrical bilaterally Back: no CVA tenderness Abdomen: soft, non-tender; bowel sounds normal; no guarding GU: deferred; vaginal swab obtained Skin: warm and dry Psychological:  Alert and cooperative. Normal mood and affect.  No results found for this or any previous visit (from the past 24 hour(s)).   Urine pregnancy negative  ASSESSMENT & PLAN:  1. Vaginal itching   2. Dysfunction of right eustachian tube     Meds ordered this encounter  Medications  . cefTRIAXone (ROCEPHIN) injection 250 mg    Order Specific Question:   Antibiotic Indication:    Answer:   STD  . azithromycin (ZITHROMAX) tablet 1,000 mg  . metroNIDAZOLE (FLAGYL) 500 MG tablet    Sig: Take 1 tablet (500 mg total)  by mouth 2 (two) times daily.    Dispense:  14 tablet    Refill:  0    Order Specific Question:   Supervising Provider    Answer:   Eustace Moore [[id number]]  . fluconazole (DIFLUCAN) 200 MG tablet    Sig: Take one dose by mouth, wait 72 hours, and then take second dose by mouth    Dispense:  2 tablet    Refill:  0    Order Specific Question:   Supervising Provider    Answer:   Eustace Moore [[id number]]    Pending: Labs Reviewed  RPR  HIV ANTIBODY (ROUTINE TESTING W REFLEX)  POC URINE PREG, ED  CERVICOVAGINAL ANCILLARY ONLY    Vaginal self-swab obtained Given rocephin 250mg  injection and azithromycin 1g in office HIV/ syphilis testing today Prescribed metronidazole 500 mg twice daily for 7 days (do not take while consuming alcohol and/or if breastfeeding) Prescribed diflucan 200 mg once daily and then second dose after you finish taking metronidazole Take medications as prescribed and to completion We will follow up with you regarding the results of your test If tests are positive, please abstain from sexual activity for at least 7 days and notify partners Follow up with PCP or with [facility] if symptoms persists Return here or go to ER if you have any new or worsening symptoms fever, chills, nausea, vomiting, abdominal or pelvic pain, painful intercourse, vaginal discharge, vaginal bleeding, persistent symptoms despite treatment, etc...  Begin taking OTC zyrtec and flonase for ear and throat discomfort Return if symptoms persists  Reviewed expectations re: course of current medical issues. Questions answered. Outlined signs and symptoms indicating need for more acute intervention. Patient verbalized understanding. After Visit Summary given.       Rennis Harding, PA-C 11/06/18 2101

## 2018-11-06 NOTE — ED Triage Notes (Signed)
Vaginal itching.  Used OTC vaginal itch medicine, no relief and this usually helps.  Patient has had symptoms for 3 weeks .

## 2018-11-07 LAB — POCT PREGNANCY, URINE: Preg Test, Ur: NEGATIVE

## 2018-11-07 LAB — RPR: RPR Ser Ql: NONREACTIVE

## 2018-11-07 LAB — HIV ANTIBODY (ROUTINE TESTING W REFLEX): HIV SCREEN 4TH GENERATION: NONREACTIVE

## 2018-11-07 LAB — CERVICOVAGINAL ANCILLARY ONLY
Bacterial vaginitis: NEGATIVE
CHLAMYDIA, DNA PROBE: NEGATIVE
Candida vaginitis: POSITIVE — AB
NEISSERIA GONORRHEA: NEGATIVE
Trichomonas: NEGATIVE

## 2019-05-05 IMAGING — CR DG CHEST 2V
2 series · 2 of 2 positions shown · non-contrast
Comparison: 05/15/2017

CLINICAL DATA: Cough, congestion

EXAM:
CHEST - 2 VIEW

[chest pa]
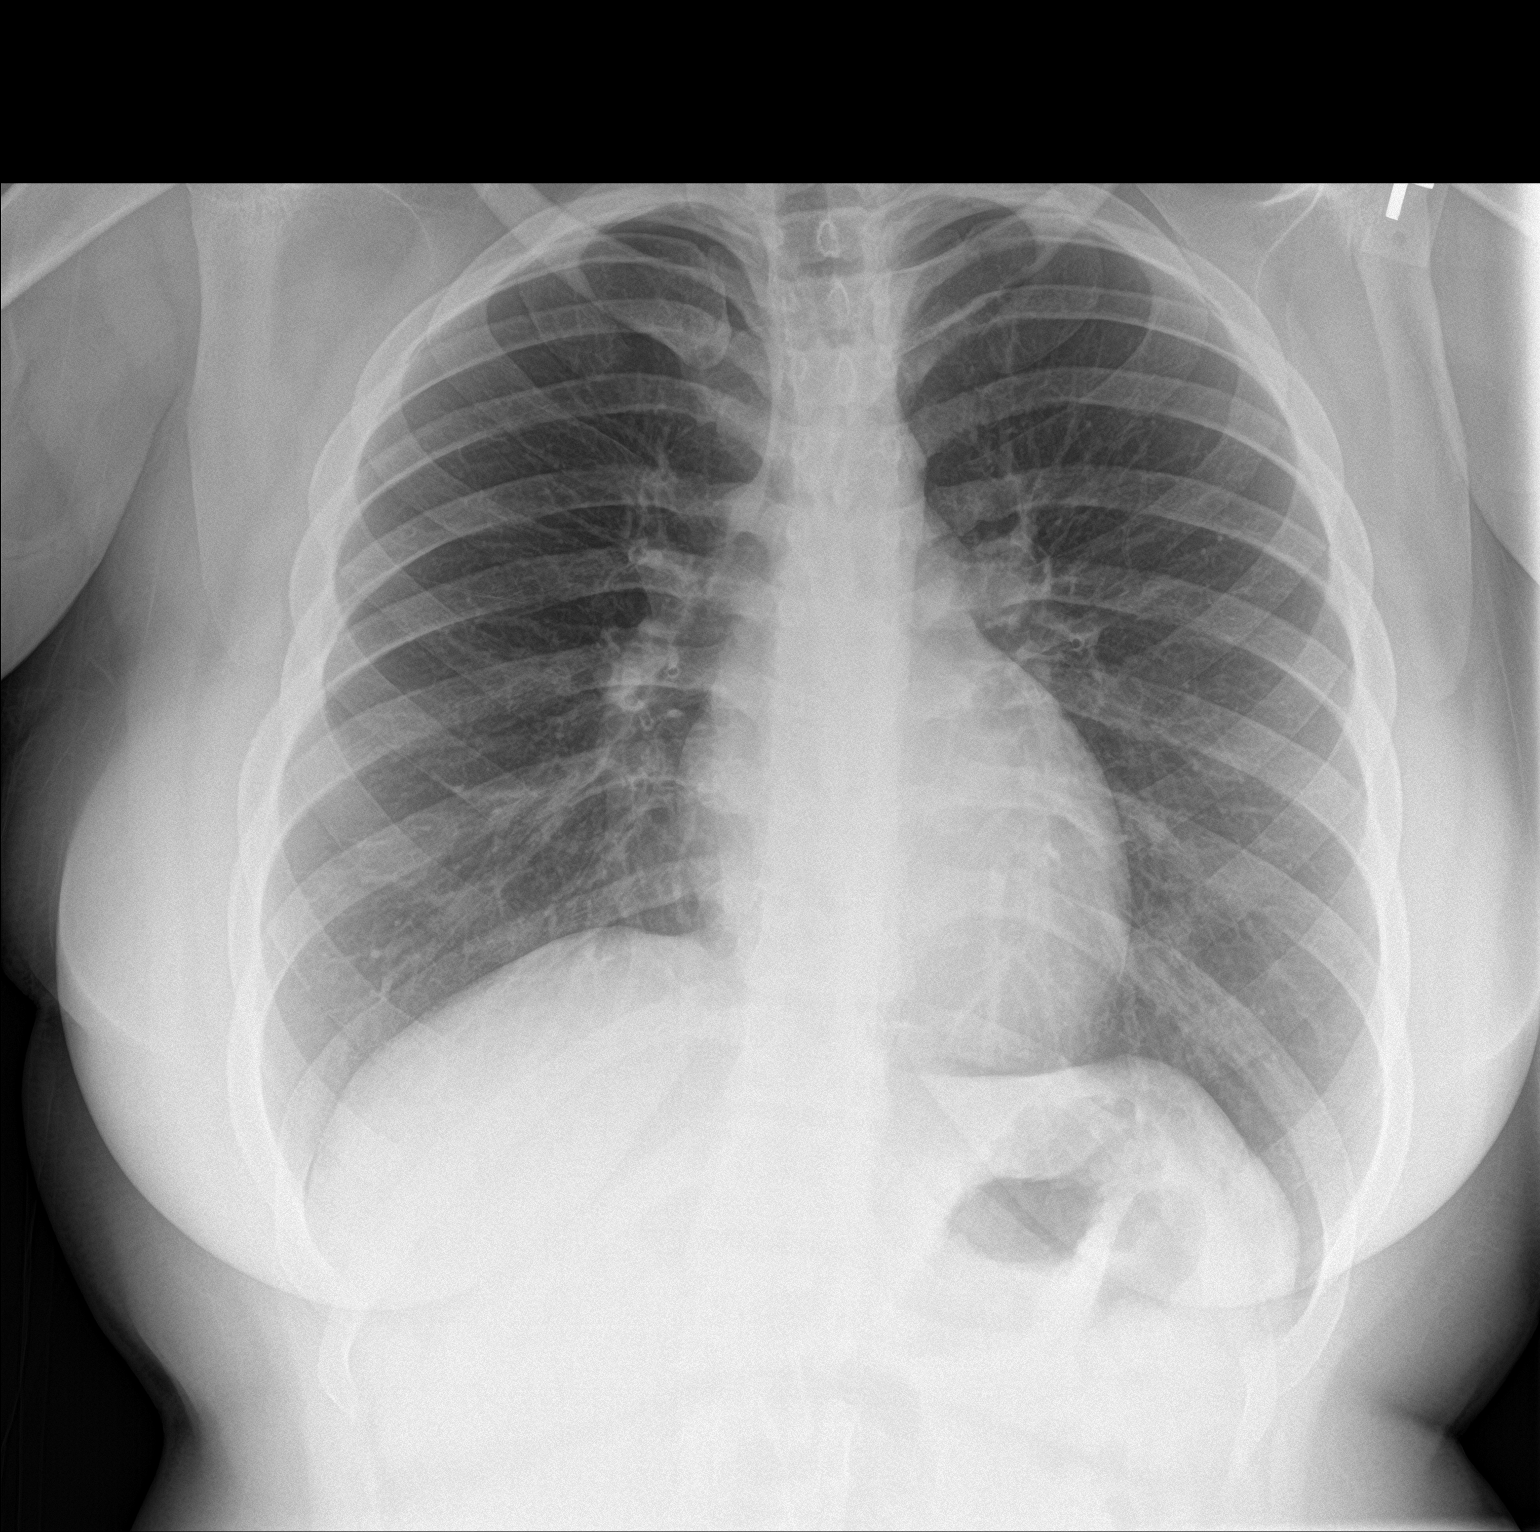

[chest lat]
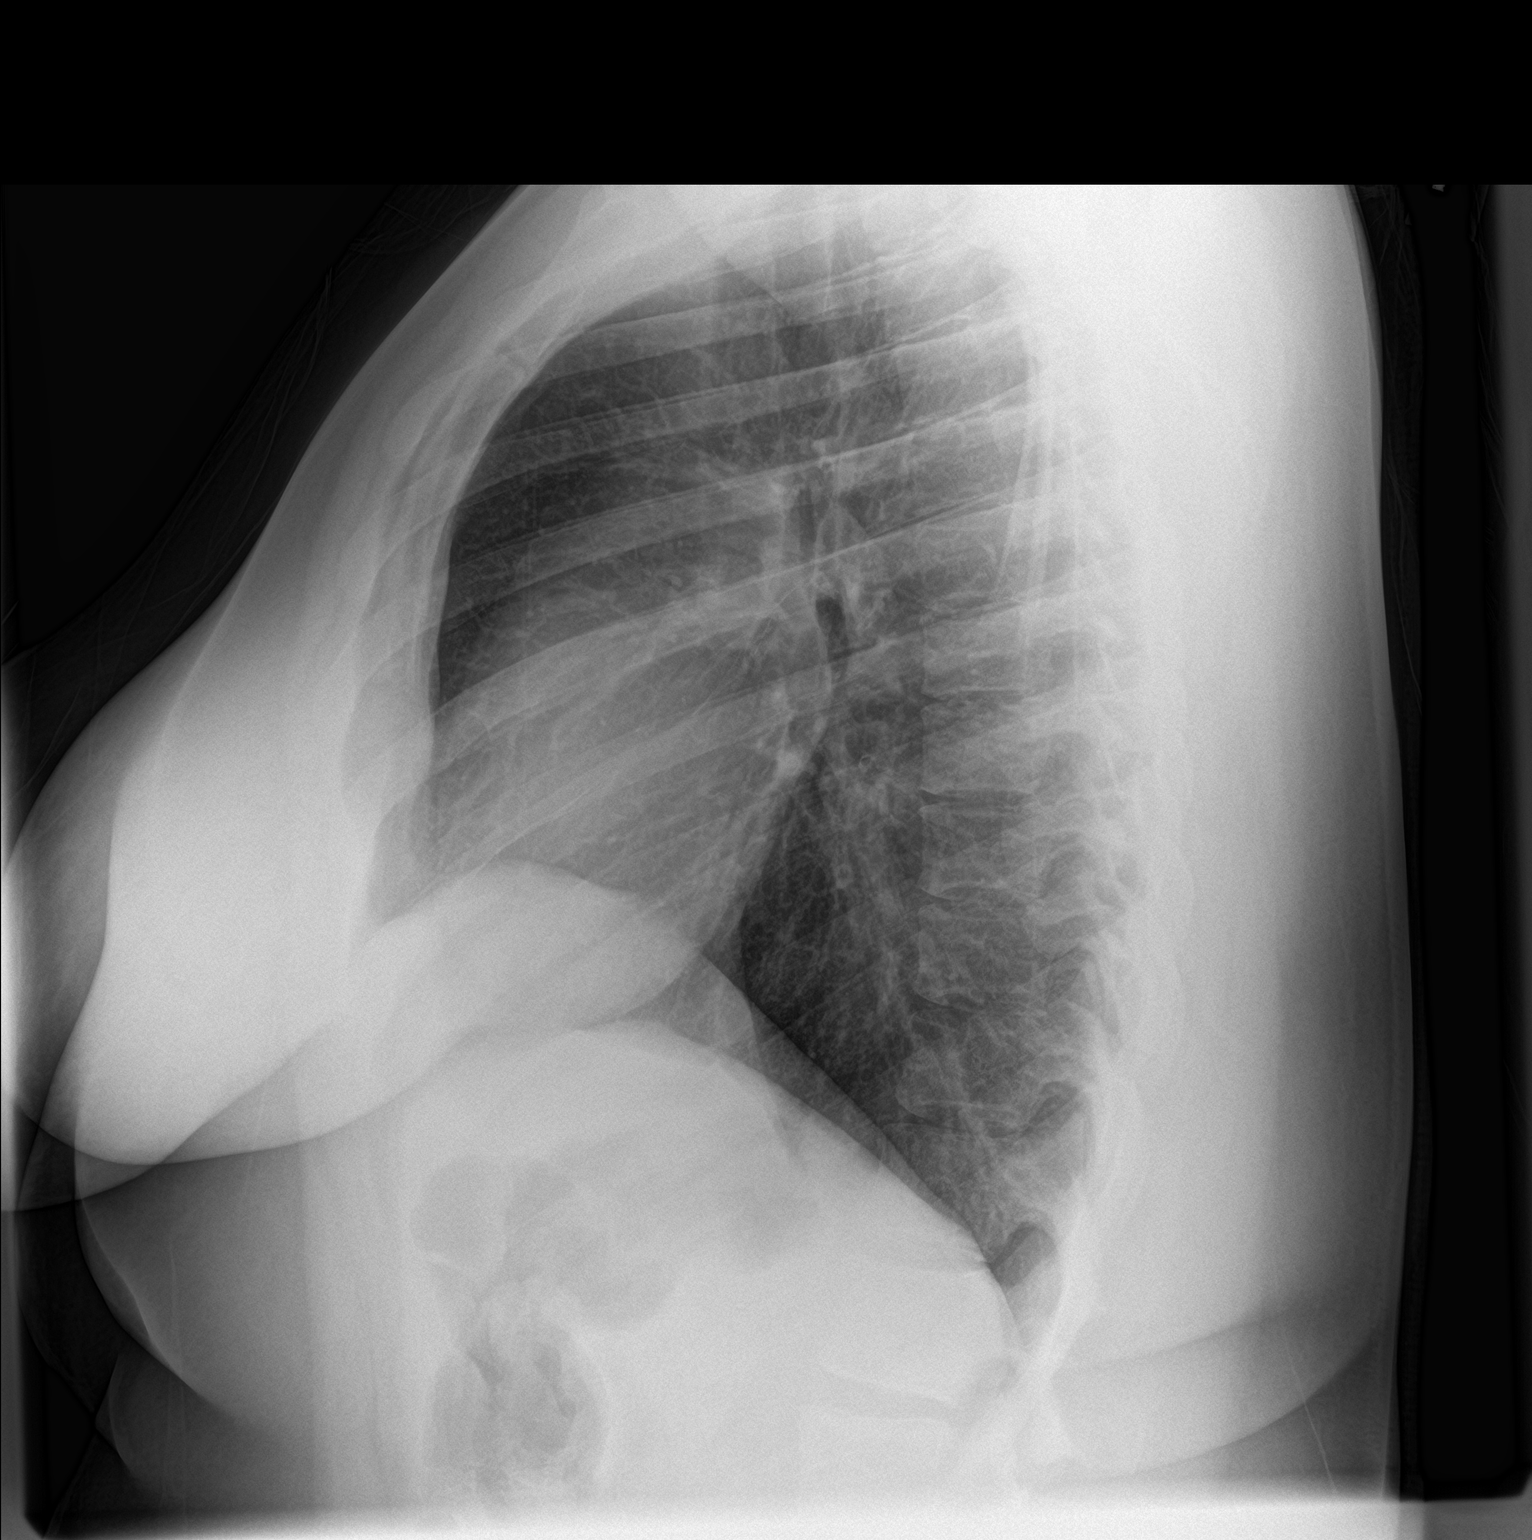

[2 of 2 positions shown; findings below may reference images not displayed]

FINDINGS: Heart and mediastinal contours are within normal limits. No focal
opacities or effusions. No acute bony abnormality.
IMPRESSION: No active cardiopulmonary disease.

## 2019-05-06 ENCOUNTER — Other Ambulatory Visit: Payer: Self-pay | Admitting: *Deleted

## 2019-05-06 DIAGNOSIS — Z20822 Contact with and (suspected) exposure to covid-19: Secondary | ICD-10-CM

## 2019-05-07 LAB — NOVEL CORONAVIRUS, NAA: SARS-CoV-2, NAA: NOT DETECTED

## 2019-06-12 ENCOUNTER — Encounter (HOSPITAL_COMMUNITY): Payer: Self-pay | Admitting: Emergency Medicine

## 2019-06-12 ENCOUNTER — Ambulatory Visit (HOSPITAL_COMMUNITY)
Admission: EM | Admit: 2019-06-12 | Discharge: 2019-06-12 | Disposition: A | Discharge status: Home / Self Care | Payer: 59 | Attending: Nurse Practitioner | Admitting: Nurse Practitioner

## 2019-06-12 ENCOUNTER — Other Ambulatory Visit: Payer: Self-pay

## 2019-06-12 DIAGNOSIS — N760 Acute vaginitis: Secondary | ICD-10-CM | POA: Diagnosis not present

## 2019-06-12 DIAGNOSIS — Z711 Person with feared health complaint in whom no diagnosis is made: Secondary | ICD-10-CM | POA: Insufficient documentation

## 2019-06-12 DIAGNOSIS — Z202 Contact with and (suspected) exposure to infections with a predominantly sexual mode of transmission: Secondary | ICD-10-CM | POA: Diagnosis not present

## 2019-06-12 DIAGNOSIS — Z3202 Encounter for pregnancy test, result negative: Secondary | ICD-10-CM

## 2019-06-12 LAB — POCT URINALYSIS DIP (DEVICE)
Bilirubin Urine: NEGATIVE
Glucose, UA: NEGATIVE mg/dL
Hgb urine dipstick: NEGATIVE
Ketones, ur: NEGATIVE mg/dL
Leukocytes,Ua: NEGATIVE
Nitrite: NEGATIVE
Protein, ur: NEGATIVE mg/dL
Specific Gravity, Urine: 1.02 (ref 1.005–1.030)
Urobilinogen, UA: 0.2 mg/dL (ref 0.0–1.0)
pH: 7 (ref 5.0–8.0)

## 2019-06-12 LAB — POCT PREGNANCY, URINE: Preg Test, Ur: NEGATIVE

## 2019-06-12 MED ORDER — AZITHROMYCIN 250 MG PO TABS
ORAL_TABLET | ORAL | Status: AC
  Filled 2019-06-12: qty 4

## 2019-06-12 MED ORDER — CEFTRIAXONE SODIUM 250 MG IJ SOLR
250.0000 mg | Freq: Once | INTRAMUSCULAR | Status: AC
  Administered 2019-06-12: 20:00:00 250 mg via INTRAMUSCULAR

## 2019-06-12 MED ORDER — CEFTRIAXONE SODIUM 250 MG IJ SOLR
INTRAMUSCULAR | Status: AC
  Filled 2019-06-12: qty 250

## 2019-06-12 MED ORDER — FLUCONAZOLE 150 MG PO TABS: 150.0000 mg | ORAL_TABLET | ORAL | 0 refills | Status: AC

## 2019-06-12 MED ORDER — AZITHROMYCIN 250 MG PO TABS
1000.0000 mg | ORAL_TABLET | Freq: Once | ORAL | Status: AC
  Administered 2019-06-12: 20:00:00 1000 mg via ORAL

## 2019-06-12 MED ORDER — METRONIDAZOLE 500 MG PO TABS: 500.0000 mg | ORAL_TABLET | Freq: Two times a day (BID) | ORAL | 0 refills | Status: DC

## 2019-06-12 NOTE — ED Provider Notes (Signed)
[facility]    CSN: [id number] Arrival date & time: 06/12/19  1901      History   Chief Complaint Chief Complaint  Patient presents with  . Vaginal Discharge    HPI Amanda Shields is a 23 y.o. female.   Subjective:   Amanda Shields is a 23 y.o. female who presents for evaluation of vaginal symptoms of itching. Symptoms have been present for 3 days. Symptoms include local irritation and vulvar itching. Menstrual pattern: bleeding regularly; LMP 9/16. Sexually active with two female partners. Contraception: none. STI Risk: Possible STD exposure   The following portions of the patient's history were reviewed and updated as appropriate: allergies, current medications, past family history, past medical history, past social history, past surgical history and problem list.       Past Medical History:  Diagnosis Date  . Abscess   . Asthma   . Endometriosis   . PCOS (polycystic ovarian syndrome)     There are no active problems to display for this patient.   Past Surgical History:  Procedure Laterality Date  . LAPAROSCOPIC OVARIAN    . WISDOM TOOTH EXTRACTION      OB History   No obstetric history on file.      Home Medications    Prior to Admission medications   Medication Sig Start Date End Date Taking? Authorizing Provider  fluconazole (DIFLUCAN) 150 MG tablet Take 1 tablet (150 mg total) by mouth every 3 (three) days for 2 doses. 06/12/19 06/16/19  Enrique Sack, FNP  metroNIDAZOLE (FLAGYL) 500 MG tablet Take 1 tablet (500 mg total) by mouth 2 (two) times daily. 06/12/19   Enrique Sack, FNP    Family History History reviewed. No pertinent family history.  Social History Social History   Tobacco Use  . Smoking status: Former Smoker    Types: Cigarettes  . Smokeless tobacco: Never Used  Substance Use Topics  . Alcohol use: No  . Drug use: No     Allergies   Patient has no known allergies.   Review of Systems Review of  Systems  Constitutional: Negative for fever.  Gastrointestinal: Negative.   Genitourinary: Negative for dysuria, flank pain, frequency and vaginal discharge.  Musculoskeletal: Negative.   Neurological: Negative.   All other systems reviewed and are negative.    Physical Exam Triage Vital Signs ED Triage Vitals [06/12/19 1918]  Enc Vitals Group     BP (!) 149/97     Pulse Rate 98     Resp 18     Temp 98.2 F (36.8 C)     Temp Source Oral     SpO2 99 %     Weight      Height      Head Circumference      Peak Flow      Pain Score 0     Pain Loc      Pain Edu?      Excl. in [place]?    No data found.  Updated Vital Signs BP (!) 149/97 (BP Location: Right Arm)   Pulse 98   Temp 98.2 F (36.8 C) (Oral)   Resp 18   SpO2 99%   Visual Acuity Right Eye Distance:   Left Eye Distance:   Bilateral Distance:    Right Eye Near:   Left Eye Near:    Bilateral Near:     Physical Exam Vitals signs reviewed.  Constitutional:      Appearance: Normal appearance.  HENT:  Head: Normocephalic.  Neck:     Musculoskeletal: Normal range of motion and neck supple.  Cardiovascular:     Rate and Rhythm: Normal rate and regular rhythm.  Pulmonary:     Effort: Pulmonary effort is normal.     Breath sounds: Normal breath sounds.  Musculoskeletal: Normal range of motion.  Skin:    General: Skin is warm and dry.  Neurological:     General: No focal deficit present.     Mental Status: She is alert and oriented to person, place, and time.      UC Treatments / Results  Labs (all labs ordered are listed, but only abnormal results are displayed) Labs Reviewed  POC URINE PREG, ED  POCT URINALYSIS DIP (DEVICE)  POCT PREGNANCY, URINE  CERVICOVAGINAL ANCILLARY ONLY    EKG   Radiology No results found.  Procedures Procedures (including critical care time)  Medications Ordered in UC Medications  azithromycin (ZITHROMAX) tablet 1,000 mg (has no administration in time range)   cefTRIAXone (ROCEPHIN) injection 250 mg (has no administration in time range)  azithromycin (ZITHROMAX) 250 MG tablet (has no administration in time range)  cefTRIAXone (ROCEPHIN) 250 MG injection (has no administration in time range)    Initial Impression / Assessment and Plan / UC Course  I have reviewed the triage vital signs and the nursing notes.  Pertinent labs & imaging results that were available during my care of the patient were reviewed by me and considered in my medical decision making (see chart for details).    23 yo female presenting with a three day history of vaginal itching and local irritation. She is concerned for possible STDs as one of her two sexual female partners has recently been unfaithful to her. She does not use condoms.   Plan:  Oral antifungal see orders. Oral antibiotics see orders. Abstinence from intercourse discussed. Partner notification discussed. Discussed safe sex.  Today's evaluation has revealed no signs of a dangerous process. Discussed diagnosis with patient and/or guardian. Patient and/or guardian aware of their diagnosis, possible red flag symptoms to watch out for and need for close follow up. Patient and/or guardian understands verbal and written discharge instructions. Patient and/or guardian comfortable with plan and disposition.  Patient and/or guardian has a clear mental status at this time, good insight into illness (after discussion and teaching) and has clear judgment to make decisions regarding their care  This care was provided during an unprecedented National Emergency due to the Novel Coronavirus (COVID-19) pandemic. COVID-19 infections and transmission risks place heavy strains on healthcare resources.  As this pandemic evolves, our facility, providers, and staff strive to respond fluidly, to remain operational, and to provide care relative to available resources and information. Outcomes are unpredictable and treatments are without  well-defined guidelines. Further, the impact of COVID-19 on all aspects of urgent care, including the impact to patients seeking care for reasons other than COVID-19, is unavoidable during this national emergency. At this time of the global pandemic, management of patients has significantly changed, even for non-COVID positive patients given high local and regional COVID volumes at this time requiring high healthcare system and resource utilization. The standard of care for management of both COVID suspected and non-COVID suspected patients continues to change rapidly at the local, regional, national, and global levels. This patient was worked up and treated to the best available but ever changing evidence and resources available at this current time.   Documentation was completed with the aid of voice recognition  software. Transcription may contain typographical errors.  Final Clinical Impressions(s) / UC Diagnoses   Final diagnoses:  Acute vaginitis  Concern about STD in female without diagnosis     Discharge Instructions     Take medications as prescribed. Avoid sexual intercourse until you receive the results of your tests. If your test it positive, then you will need to inform your partners and have them treated. No sex for at least 7 days from when your partner is treated to avoid re-infection. You will only be notified for positive results. You can go online to MyChart in a couple of days to review all the results from your testing. Remember not to drink alcohol while taking the flagyl.      ED Prescriptions    Medication Sig Dispense Auth. Provider   metroNIDAZOLE (FLAGYL) 500 MG tablet Take 1 tablet (500 mg total) by mouth 2 (two) times daily. 14 tablet Lurline Idol, FNP   fluconazole (DIFLUCAN) 150 MG tablet Take 1 tablet (150 mg total) by mouth every 3 (three) days for 2 doses. 2 tablet Lurline Idol, FNP     PDMP not reviewed this encounter.   Lurline Idol, [place]  06/12/19 1954

## 2019-06-12 NOTE — ED Triage Notes (Signed)
Pt here for vaginal discharge  

## 2019-06-12 NOTE — Discharge Instructions (Signed)
Take medications as prescribed. Avoid sexual intercourse until you receive the results of your tests. If your test it positive, then you will need to inform your partners and have them treated. No sex for at least 7 days from when your partner is treated to avoid re-infection. You will only be notified for positive results. You can go online to MyChart in a couple of days to review all the results from your testing. Remember not to drink alcohol while taking the flagyl.

## 2019-06-16 LAB — CERVICOVAGINAL ANCILLARY ONLY
Bacterial vaginitis: POSITIVE — AB
Candida vaginitis: POSITIVE — AB
Chlamydia: NEGATIVE
Neisseria Gonorrhea: NEGATIVE
Trichomonas: NEGATIVE

## 2019-07-02 ENCOUNTER — Ambulatory Visit (HOSPITAL_COMMUNITY)
Admission: EM | Admit: 2019-07-02 | Discharge: 2019-07-02 | Disposition: A | Discharge status: Home / Self Care | Payer: 59 | Attending: Family Medicine | Admitting: Family Medicine

## 2019-07-02 ENCOUNTER — Other Ambulatory Visit: Payer: Self-pay

## 2019-07-02 ENCOUNTER — Encounter (HOSPITAL_COMMUNITY): Payer: Self-pay | Admitting: Emergency Medicine

## 2019-07-02 DIAGNOSIS — M791 Myalgia, unspecified site: Secondary | ICD-10-CM

## 2019-07-02 DIAGNOSIS — Z20828 Contact with and (suspected) exposure to other viral communicable diseases: Secondary | ICD-10-CM | POA: Diagnosis not present

## 2019-07-02 DIAGNOSIS — J45909 Unspecified asthma, uncomplicated: Secondary | ICD-10-CM | POA: Insufficient documentation

## 2019-07-02 DIAGNOSIS — J029 Acute pharyngitis, unspecified: Secondary | ICD-10-CM | POA: Insufficient documentation

## 2019-07-02 DIAGNOSIS — R509 Fever, unspecified: Secondary | ICD-10-CM | POA: Diagnosis present

## 2019-07-02 LAB — POCT RAPID STREP A: Streptococcus, Group A Screen (Direct): NEGATIVE

## 2019-07-02 MED ORDER — PENICILLIN G BENZATHINE 1200000 UNIT/2ML IM SUSP
1.2000 10*6.[IU] | Freq: Once | INTRAMUSCULAR | Status: AC
  Administered 2019-07-02: 20:00:00 1.2 10*6.[IU] via INTRAMUSCULAR

## 2019-07-02 MED ORDER — NAPROXEN 500 MG PO TABS: 500.0000 mg | ORAL_TABLET | Freq: Two times a day (BID) | ORAL | 0 refills | Status: AC

## 2019-07-02 MED ORDER — ACETAMINOPHEN 325 MG PO TABS
650.0000 mg | ORAL_TABLET | Freq: Once | ORAL | Status: AC
  Administered 2019-07-02: 650 mg via ORAL

## 2019-07-02 MED ORDER — PENICILLIN G BENZATHINE 1200000 UNIT/2ML IM SUSP
INTRAMUSCULAR | Status: AC
  Filled 2019-07-02: qty 2

## 2019-07-02 MED ORDER — ACETAMINOPHEN 325 MG PO TABS
ORAL_TABLET | ORAL | Status: AC
  Filled 2019-07-02: qty 1

## 2019-07-02 NOTE — ED Provider Notes (Addendum)
MRN: [id number] DOB: 11-14-1995  Subjective:   Edit Ricciardelli is a 23 y.o. female presenting for 4-day history of acute onset worsening throat pain with associated body aches and chills.  Patient states that she is worse now hurting on the left side of her throat and having radiation into her ear and neck.  She is not currently taking any medications.   No Known Allergies   Past Medical History:  Diagnosis Date  . Abscess   . Asthma   . Endometriosis   . PCOS (polycystic ovarian syndrome)      Past Surgical History:  Procedure Laterality Date  . LAPAROSCOPIC OVARIAN    . WISDOM TOOTH EXTRACTION      Review of Systems  Constitutional: Positive for chills, fever and malaise/fatigue.  HENT: Positive for ear pain and sore throat. Negative for congestion and sinus pain.   Eyes: Negative for discharge and redness.  Respiratory: Negative for cough, hemoptysis, shortness of breath and wheezing.   Cardiovascular: Negative for chest pain.  Gastrointestinal: Negative for abdominal pain, diarrhea, nausea and vomiting.  Genitourinary: Negative for dysuria, flank pain and hematuria.  Musculoskeletal: Negative for myalgias.  Skin: Negative for rash.  Neurological: Negative for dizziness, weakness and headaches.  Psychiatric/Behavioral: Negative for depression and substance abuse.    Objective:   Vitals: BP 121/63 (BP Location: Left Arm)   Pulse (!) 109   Temp (!) 101.8 F (38.8 C) (Oral)   Resp 18   LMP 06/23/2019   SpO2 100%   Physical Exam Constitutional:      General: She is not in acute distress.    Appearance: She is well-developed. She is not ill-appearing.  HENT:     Head: Normocephalic and atraumatic.     Right Ear: Tympanic membrane and ear canal normal. No drainage or tenderness. No middle ear effusion. Tympanic membrane is not erythematous.     Left Ear: Tympanic membrane and ear canal normal. No drainage or tenderness.  No middle ear effusion. Tympanic membrane  is not erythematous.     Nose: No congestion or rhinorrhea.     Mouth/Throat:     Mouth: Mucous membranes are moist. No oral lesions.     Pharynx: Pharyngeal swelling (1+, left-sided), oropharyngeal exudate (Left-sided) and posterior oropharyngeal erythema (Left-sided) present. No uvula swelling.     Tonsils: No tonsillar exudate or tonsillar abscesses.  Eyes:     Extraocular Movements:     Right eye: Normal extraocular motion.     Left eye: Normal extraocular motion.     Conjunctiva/sclera: Conjunctivae normal.     Pupils: Pupils are equal, round, and reactive to light.  Neck:     Musculoskeletal: Normal range of motion and neck supple.  Cardiovascular:     Rate and Rhythm: Normal rate.  Pulmonary:     Effort: Pulmonary effort is normal.  Lymphadenopathy:     Cervical: No cervical adenopathy.  Skin:    General: Skin is warm and dry.  Neurological:     General: No focal deficit present.     Mental Status: She is alert and oriented to person, place, and time.  Psychiatric:        Mood and Affect: Mood normal.        Behavior: Behavior normal.     Results for orders placed or performed during the hospital encounter of 07/02/19 (from the past 24 hour(s))  POCT rapid strep A [facility])     Status: None   Collection Time: 07/02/19  6:52 PM  Result Value Ref Range   Streptococcus, Group A Screen (Direct) NEGATIVE NEGATIVE    Assessment and Plan :   1. Pharyngitis, unspecified etiology   2. Sore throat   3. Fever, unspecified     Will treat empirically for strep pharyngitis given physical exam findings.  Covid testing is pending, no evidence of retropharyngeal abscess at this time.  Patient given IM penicillin in clinic, use supportive care otherwise. Counseled patient on potential for adverse effects with medications prescribed/recommended today, ER and return-to-clinic precautions discussed, patient verbalized understanding.   Jaynee Eagles, PA-C 07/02/19 1901

## 2019-07-02 NOTE — ED Triage Notes (Addendum)
Sore throat started Sunday night, general body aches and chills  Left side of throat more sore than right and left ear soreness

## 2019-07-05 LAB — CULTURE, GROUP A STREP (THRC)

## 2019-07-05 LAB — NOVEL CORONAVIRUS, NAA (HOSP ORDER, SEND-OUT TO REF LAB; TAT 18-24 HRS): SARS-CoV-2, NAA: NOT DETECTED
